# Patient Record
Sex: Female | Born: 1935 | Race: White | Hispanic: No | Marital: Married | Smoking: Never smoker
Health system: Southern US, Community
[De-identification: ages and names within clinical notes are randomized; demographics above are authoritative.]

## PROBLEM LIST (undated history)

## (undated) DIAGNOSIS — H269 Unspecified cataract: Secondary | ICD-10-CM

## (undated) DIAGNOSIS — I350 Nonrheumatic aortic (valve) stenosis: Secondary | ICD-10-CM

## (undated) DIAGNOSIS — I10 Essential (primary) hypertension: Secondary | ICD-10-CM

## (undated) DIAGNOSIS — I2510 Atherosclerotic heart disease of native coronary artery without angina pectoris: Secondary | ICD-10-CM

## (undated) DIAGNOSIS — M1990 Unspecified osteoarthritis, unspecified site: Secondary | ICD-10-CM

## (undated) DIAGNOSIS — I639 Cerebral infarction, unspecified: Secondary | ICD-10-CM

## (undated) DIAGNOSIS — I509 Heart failure, unspecified: Secondary | ICD-10-CM

## (undated) DIAGNOSIS — E7800 Pure hypercholesterolemia, unspecified: Secondary | ICD-10-CM

## (undated) HISTORY — DX: Cerebral infarction, unspecified: I63.9

## (undated) HISTORY — PX: CORONARY ANGIOPLASTY: SHX604

## (undated) HISTORY — DX: Heart failure, unspecified: I50.9

## (undated) HISTORY — DX: Unspecified osteoarthritis, unspecified site: M19.90

## (undated) HISTORY — PX: OTHER SURGICAL HISTORY: SHX169

## (undated) HISTORY — DX: Unspecified cataract: H26.9

## (undated) HISTORY — DX: Nonrheumatic aortic (valve) stenosis: I35.0

## (undated) HISTORY — DX: Atherosclerotic heart disease of native coronary artery without angina pectoris: I25.10

## (undated) HISTORY — DX: Essential (primary) hypertension: I10

## (undated) HISTORY — DX: Pure hypercholesterolemia, unspecified: E78.00

## (undated) HISTORY — PX: CATARACT EXTRACTION: SUR2

---

## 1997-11-11 ENCOUNTER — Other Ambulatory Visit: Admission: RE | Admit: 1997-11-11 | Discharge: 1997-11-11 | Payer: Self-pay | Admitting: Obstetrics and Gynecology

## 2004-05-26 ENCOUNTER — Ambulatory Visit: Payer: Self-pay | Admitting: Cardiovascular Disease

## 2004-06-09 ENCOUNTER — Ambulatory Visit: Payer: Self-pay | Admitting: Cardiovascular Disease

## 2004-06-09 ENCOUNTER — Ambulatory Visit: Payer: Self-pay

## 2004-12-21 ENCOUNTER — Ambulatory Visit: Payer: Self-pay | Admitting: Cardiovascular Disease

## 2005-07-12 ENCOUNTER — Ambulatory Visit: Payer: Self-pay | Admitting: Cardiovascular Disease

## 2006-01-12 ENCOUNTER — Ambulatory Visit: Payer: Self-pay | Admitting: Cardiovascular Disease

## 2006-02-12 ENCOUNTER — Emergency Department (HOSPITAL_COMMUNITY): Admission: EM | Admit: 2006-02-12 | Discharge: 2006-02-12 | Payer: Self-pay | Admitting: *Deleted

## 2006-07-20 ENCOUNTER — Ambulatory Visit: Payer: Self-pay | Admitting: Cardiovascular Disease

## 2007-02-16 ENCOUNTER — Ambulatory Visit: Payer: Self-pay

## 2007-02-16 ENCOUNTER — Ambulatory Visit: Payer: Self-pay | Admitting: Cardiovascular Disease

## 2007-05-30 ENCOUNTER — Ambulatory Visit: Payer: Self-pay

## 2007-07-09 ENCOUNTER — Ambulatory Visit: Payer: Self-pay | Admitting: Cardiovascular Disease

## 2007-09-19 ENCOUNTER — Ambulatory Visit: Payer: Self-pay | Admitting: Cardiovascular Disease

## 2007-10-16 ENCOUNTER — Ambulatory Visit: Payer: Self-pay | Admitting: Ophthalmology

## 2007-10-16 ENCOUNTER — Other Ambulatory Visit: Payer: Self-pay

## 2007-10-30 ENCOUNTER — Ambulatory Visit: Payer: Self-pay | Admitting: Ophthalmology

## 2007-12-13 ENCOUNTER — Ambulatory Visit: Payer: Self-pay | Admitting: Ophthalmology

## 2007-12-26 ENCOUNTER — Ambulatory Visit: Payer: Self-pay | Admitting: Ophthalmology

## 2008-03-20 ENCOUNTER — Ambulatory Visit: Payer: Self-pay | Admitting: Cardiovascular Disease

## 2008-03-20 ENCOUNTER — Ambulatory Visit: Payer: Self-pay

## 2008-03-20 ENCOUNTER — Encounter: Payer: Self-pay | Admitting: Cardiovascular Disease

## 2009-03-02 DIAGNOSIS — Z8739 Personal history of other diseases of the musculoskeletal system and connective tissue: Secondary | ICD-10-CM | POA: Insufficient documentation

## 2009-03-02 DIAGNOSIS — E7800 Pure hypercholesterolemia, unspecified: Secondary | ICD-10-CM | POA: Insufficient documentation

## 2009-03-02 DIAGNOSIS — R7303 Prediabetes: Secondary | ICD-10-CM

## 2009-03-02 DIAGNOSIS — I359 Nonrheumatic aortic valve disorder, unspecified: Secondary | ICD-10-CM | POA: Insufficient documentation

## 2009-03-02 DIAGNOSIS — I2510 Atherosclerotic heart disease of native coronary artery without angina pectoris: Secondary | ICD-10-CM

## 2009-03-02 DIAGNOSIS — I10 Essential (primary) hypertension: Secondary | ICD-10-CM | POA: Insufficient documentation

## 2009-03-05 ENCOUNTER — Ambulatory Visit: Payer: Self-pay | Admitting: Cardiovascular Disease

## 2009-11-12 ENCOUNTER — Ambulatory Visit: Admitting: Unknown Physician Specialty

## 2010-03-10 ENCOUNTER — Ambulatory Visit: Payer: Self-pay | Admitting: Cardiovascular Disease

## 2010-06-24 NOTE — Assessment & Plan Note (Signed)
Summary: F1Y/DM   CC:  check up.  History of Present Illness: [name] [name] is seen today for F/U of CAD, AS and risk factors.  Unfortunately her husband of 55 years died in Aug 19, 2022.  He had COPD.  She has a son near by but has difficulty with being alone.  He last echo in 2009 showed mild AS with gradients of 51mmHg/24mmHg.  She has a distant history of CAD.  She had PCI of the LAD by Dr. Tresa Endo in 1996, and stenting of the ostial RCa and circ by Dr. Primitivo Gauze in 1997.  She had a nonischemic myovue in January of 2009.  She is not having any SSCP but is sedentary.  She is tolerating low dose Crestor.  There has been no SSCP, PND, orthopnea, edema or palptations.  Current Problems (verified): 1)  Cad  (ICD-414.00) 2)  Aortic Stenosis  (ICD-424.1) 3)  Hypertension  (ICD-401.9) 4)  Arthritis, Hx of  (ICD-V13.4) 5)  Dm  (ICD-250.00) 6)  Hypercholesterolemia  (ICD-272.0)  Current Medications (verified): 1)  Aspirin 81 Mg Tbec (Aspirin) .... Take One Tablet By Mouth Daily 2)  Quinapril Hcl 40 Mg Tabs (Quinapril Hcl) .Marland Kitchen.. 1 Tab By Mouth Once Daily 3)  Furosemide 40 Mg Tabs (Furosemide) .... Take One Tablet By Mouth Daily. 4)  Glipizide 10 Mg Tabs (Glipizide) .Marland Kitchen.. 1 Tab By Mouth Once Daily 5)  Crestor 10 Mg Tabs (Rosuvastatin Calcium) .Marland Kitchen.. 1 Tab By Mouth Every Other Day 6)  Labetalol Hcl 200 Mg Tabs (Labetalol Hcl) .... 2 Tabs By Mouth Two Times A Day  Allergies (verified): No Known Drug Allergies  Past History:  Past Medical History: Last updated: 03/02/2009 Current Problems:  AORTIC STENOSIS (ICD-424.1) mild gradient 10/24 mmHG echo 02/2008 HYPERTENSION (ICD-401.9) ARTHRITIS, HX OF (ICD-V13.4) DM (ICD-250.00) HYPERCHOLESTEROLEMIA (ICD-272.0) CAD: distant history PCi RCA and Circ ?> 1997  Family History: Last updated: 03/02/2009 non-contributory  Social History: Last updated: 03/05/2009 Widowed August 18, 2008 Non-smoker Non-drinker Sedentary Has son 5 minutes away  Review of Systems   Denies fever, malais, weight loss, blurry vision, decreased visual acuity, cough, sputum, SOB, hemoptysis, pleuritic pain, palpitaitons, heartburn, abdominal pain, melena, lower extremity edema, claudication, or rash.   Vital Signs:  Patient profile:   77 year old female Height:      59 inches Weight:      174 pounds BMI:     35.27 Pulse rate:   72 / minute Resp:     14 per minute BP sitting:   130 / 70  (left arm)  Vitals Entered By: Kem Parkinson (March 10, 2010 10:04 AM)  Physical Exam  General:  Affect appropriate Healthy:  appears stated age HEENT: normal Neck supple with no adenopathy JVP normal no bruits no thyromegaly Lungs clear with no wheezing and good diaphragmatic motion Heart:  S1/S2 mild AS  murmur,rub, gallop or click PMI normal Abdomen: benighn, BS positve, no tenderness, no AAA no bruit.  No HSM or HJR Distal pulses intact with no bruits No edema Neuro non-focal Skin warm and dry    Impression & Recommendations:  Problem # 1:  CAD (ICD-414.00) Stable no angina continue ASA and BB Her updated medication list for this problem includes:    Aspirin 81 Mg Tbec (Aspirin) .Marland Kitchen... Take one tablet by mouth daily    Quinapril Hcl 40 Mg Tabs (Quinapril hcl) .Marland Kitchen... 1 tab by mouth once daily    Labetalol Hcl 200 Mg Tabs (Labetalol hcl) .Marland Kitchen... 2 tabs by mouth two times a day  Problem # 2:  AORTIC STENOSIS (ICD-424.1)  Still mild by exam F/U echo in 6 months Her updated medication list for this problem includes:    Quinapril Hcl 40 Mg Tabs (Quinapril hcl) .Marland Kitchen... 1 tab by mouth once daily    Furosemide 40 Mg Tabs (Furosemide) .Marland Kitchen... Take one tablet by mouth daily.    Labetalol Hcl 200 Mg Tabs (Labetalol hcl) .Marland Kitchen... 2 tabs by mouth two times a day  Orders: Echocardiogram (Echo)  Her updated medication list for this problem includes:    Quinapril Hcl 40 Mg Tabs (Quinapril hcl) .Marland Kitchen... 1 tab by mouth once daily    Furosemide 40 Mg Tabs (Furosemide) .Marland Kitchen... Take one  tablet by mouth daily.    Labetalol Hcl 200 Mg Tabs (Labetalol hcl) .Marland Kitchen... 2 tabs by mouth two times a day  Problem # 3:  HYPERTENSION (ICD-401.9)  Well controlled Her updated medication list for this problem includes:    Aspirin 81 Mg Tbec (Aspirin) .Marland Kitchen... Take one tablet by mouth daily    Quinapril Hcl 40 Mg Tabs (Quinapril hcl) .Marland Kitchen... 1 tab by mouth once daily    Furosemide 40 Mg Tabs (Furosemide) .Marland Kitchen... Take one tablet by mouth daily.    Labetalol Hcl 200 Mg Tabs (Labetalol hcl) .Marland Kitchen... 2 tabs by mouth two times a day  Her updated medication list for this problem includes:    Aspirin 81 Mg Tbec (Aspirin) .Marland Kitchen... Take one tablet by mouth daily    Quinapril Hcl 40 Mg Tabs (Quinapril hcl) .Marland Kitchen... 1 tab by mouth once daily    Furosemide 40 Mg Tabs (Furosemide) .Marland Kitchen... Take one tablet by mouth daily.    Labetalol Hcl 200 Mg Tabs (Labetalol hcl) .Marland Kitchen... 2 tabs by mouth two times a day  Problem # 4:  HYPERCHOLESTEROLEMIA (ICD-272.0) Tolerating crestor every other day.  F/U labs primary Her updated medication list for this proTblem includes:    Crestor 10 Mg Tabs (Rosuvastatin calcium) .Marland Kitchen... 1 tab by mouth every other day  Patient Instructions: 1)  Your physician recommends that you schedule a follow-up appointment in: 6 MONTHS WITH DR Eden Emms AND ECHO SAME DAY 2)  Your physician has recommended you make the following change in your medication:  3)  Your physician has requested that you have an echocardiogram.  Echocardiography is a painless test that uses sound waves to create images of your heart. It provides your doctor with information about the size and shape of your heart and how well your heart's chambers and valves are working.  This procedure takes approximately one hour. There are no restrictions for this procedure.6 MONTHS   EKG Report  Procedure date:  03/10/2010  Findings:      NSR 72 LVH Nonspecfic ST/T wave changes.

## 2010-09-16 ENCOUNTER — Encounter: Payer: Self-pay | Admitting: Cardiovascular Disease

## 2010-09-17 ENCOUNTER — Encounter: Payer: Self-pay | Admitting: Cardiovascular Disease

## 2010-09-17 ENCOUNTER — Ambulatory Visit (INDEPENDENT_AMBULATORY_CARE_PROVIDER_SITE_OTHER): Payer: Medicare Other | Admitting: Cardiovascular Disease

## 2010-09-17 VITALS — BP 175/79 | HR 71 | Resp 14 | Ht 59.0 in | Wt 178.0 lb

## 2010-09-17 DIAGNOSIS — I359 Nonrheumatic aortic valve disorder, unspecified: Secondary | ICD-10-CM

## 2010-09-17 DIAGNOSIS — I2510 Atherosclerotic heart disease of native coronary artery without angina pectoris: Secondary | ICD-10-CM

## 2010-09-17 DIAGNOSIS — E7800 Pure hypercholesterolemia, unspecified: Secondary | ICD-10-CM

## 2010-09-17 DIAGNOSIS — I350 Nonrheumatic aortic (valve) stenosis: Secondary | ICD-10-CM

## 2010-09-17 DIAGNOSIS — I10 Essential (primary) hypertension: Secondary | ICD-10-CM

## 2010-09-17 NOTE — Assessment & Plan Note (Signed)
Likely moderate by exam  F/U echo

## 2010-09-17 NOTE — Patient Instructions (Signed)
Your physician has requested that you have an echocardiogram. Echocardiography is a painless test that uses sound waves to create images of your heart. It provides your doctor with information about the size and shape of your heart and how well your heart's chambers and valves are working. This procedure takes approximately one hour. There are no restrictions for this procedure.  Your physician recommends that you schedule a follow-up appointment in: 6 months with Dr. Eden Emms

## 2010-09-17 NOTE — Assessment & Plan Note (Signed)
Stable with no angina and good activity level.  Continue medical Rx  

## 2010-09-17 NOTE — Assessment & Plan Note (Signed)
Well controlled.  Continue current medications and low sodium Dash type diet.    

## 2010-09-17 NOTE — Progress Notes (Signed)
[name] [name] is seen today for F/U of CAD, AS and risk factors.  Unfortunately her husband of 55 years died in 2022/08/20.  He had COPD.  She has a son near by but has difficulty with being alone.  He last echo in 2009 showed mild AS with gradients of 60mmHg/24mmHg.  She has a distant history of CAD.  She had PCI of the LAD by Dr. Tresa Endo in 1996, and stenting of the ostial RCa and circ by Dr. Primitivo Gauze in 1997.  She had a nonischemic myovue in January of 2009.  She is not having any SSCP but is sedentary.  She is tolerating low dose Crestor.  There has been no SSCP, PND, orthopnea, edema or palptations.  ROS: Denies fever, malais, weight loss, blurry vision, decreased visual acuity, cough, sputum, SOB, hemoptysis, pleuritic pain, palpitaitons, heartburn, abdominal pain, melena, lower extremity edema, claudication, or rash.   General: Affect appropriate Healthy:  appears stated age HEENT: normal Neck supple with no adenopathy JVP normal no bruits no thyromegaly Lungs clear with no wheezing and good diaphragmatic motion Heart:  S1/S2 AS  Murmur no ,rub, gallop or click PMI normal Abdomen: benighn, BS positve, no tenderness, no AAA no bruit.  No HSM or HJR Distal pulses intact with no bruits No edema Neuro non-focal Skin warm and dry No muscular weakness   Current Outpatient Prescriptions  Medication Sig Dispense Refill  . aspirin 81 MG tablet Take 81 mg by mouth daily.        . furosemide (LASIX) 40 MG tablet Take 40 mg by mouth daily.       Marland Kitchen glipiZIDE (GLUCOTROL) 10 MG tablet Take 10 mg by mouth daily.        Marland Kitchen labetalol (NORMODYNE) 200 MG tablet Take by mouth. Take 2 tablets twice a day       . quinapril (ACCUPRIL) 40 MG tablet Take 40 mg by mouth at bedtime.        . rosuvastatin (CRESTOR) 10 MG tablet Take 10 mg by mouth daily.          Allergies  Review of patient's allergies indicates not on file.  Electrocardiogram:  Assessment and Plan

## 2010-09-17 NOTE — Assessment & Plan Note (Signed)
Cholesterol is at goal.  Continue current dose of statin and diet Rx.  No myalgias or side effects.  F/U  LFT's in 6 months. No results found for this basename: LDLCALC             

## 2010-09-30 ENCOUNTER — Ambulatory Visit (HOSPITAL_COMMUNITY): Payer: Medicare Other | Attending: Cardiovascular Disease | Admitting: Radiology

## 2010-09-30 DIAGNOSIS — I350 Nonrheumatic aortic (valve) stenosis: Secondary | ICD-10-CM

## 2010-09-30 DIAGNOSIS — I359 Nonrheumatic aortic valve disorder, unspecified: Secondary | ICD-10-CM | POA: Insufficient documentation

## 2010-10-01 ENCOUNTER — Telehealth: Payer: Self-pay | Admitting: Cardiovascular Disease

## 2010-10-01 NOTE — Telephone Encounter (Signed)
Pt aware of echo results [name] [name]  

## 2010-10-01 NOTE — Telephone Encounter (Signed)
Pt rtn call to debra from yesterday, was told to call back this am

## 2010-10-05 NOTE — Assessment & Plan Note (Signed)
[facility]                            CARDIOLOGY OFFICE NOTE   NAME:[name], [name]                           MRN:          [id number]  DATE:03/20/2008                            DOB:          30-Nov-1931    [name] [name] returns today for followup.  She had a 2-D echocardiogram today, I  revived it.  She continues to have mild aortic stenosis.  Mean gradient  was 14, peak gradient was 20.  She is otherwise doing fairly well.  She  has a bit of arthritis in her knees and is getting around with a cane.  She has a distant history of coronary angioplasty of the distal circ.  Myoview in January 2009 was nonischemic with a normal EF.  She is not  having chest pain.  Her risk factors including hypercholesterolemia,  diabetes and hypertension are well controlled.  She continues to Dr. Marisue Brooklyn for medical needs.  She has been having fun playing with her  75 year old grandson, [name] [name].  She has two older granddaughters who are  teenagers.   She is intolerant to STATIN DRUGS.   REVIEW OF SYSTEMS:  Otherwise negative in particular.  She is not having  significant dyspnea, PND, or orthopnea.  Lower extremity edema has  improved.   MEDICATIONS:  1. Quinapril 40 a day.  2. Lasix 40 a day.  3. Glipizide 10 a day.  4. Aspirin a day.  5. Crestor 5 a day.   PHYSICAL EXAMINATION:  GENERAL:  Her exam is remarkable for an  overweight white female with some hirsutism.  VITAL SIGNS:  Weight is 179, blood pressure 140/80, pulse 80 and  regular, respiratory rate 14, afebrile.  HEENT:  Unremarkable.  NECK:  Carotids are normal without bruit.  No lymphadenopathy,  thyromegaly, or JVP elevation.  LUNGS:  Clear with good diaphragmatic motion.  No wheezing.  HEART:  S1 with a mild AS murmur.  Second heart sound is preserved.  PMI  normal.  ABDOMEN:  Benign.  Bowel sounds positive.  No AAA, no tenderness, no  bruit; no hepatosplenomegaly, hepatojugular reflux, or tenderness.  EXTREMITIES:  Distal pulses are intact.  No edema.  NEURO:  Nonfocal.  SKIN:  Warm and dry.  MUSCULOSKELETAL:  No muscular weakness.   IMPRESSION:  1. Mild aortic stenosis.  Followup echo in a year.  2. Hypercholesterolemia, last LDL cholesterol was around 100, this is      reasonable.  Continue low-dose Crestor.  3. Hypertension, currently well controlled.  Continue current dose of      angiotensin-converting enzyme inhibitor and diuretic.  4. Significant arthritis.  As needed Celebrex or Motrin.  5. Primary care needs.  The patient did get her flu shot.  She will      follow up with Dr. Marisue Brooklyn.  I will see her back in a year.     Noralyn Pick. Eden Emms, MD, [facility]  Electronically Signed    PCN/MedQ  DD: 03/20/2008  DT: 03/21/2008  Job #: [id number]

## 2010-10-05 NOTE — Assessment & Plan Note (Signed)
[facility]                            CARDIOLOGY OFFICE NOTE   NAME:PELLJonne, Rote                           MRN:          [id number]  DATE:09/19/2007                            DOB:          1931/10/16    [name] [name] returns today for follow-up.  She is status post a distant history  of coronary artery angioplasty of the distal right circ.  She had a  Myoview in January 2009, which was nonischemic with a good EF.  She is  not having chest pain.  Her activity is limited by knee pain.  She sees  Dr. Hayden Rasmussen for this.  She has mild aortic stenosis and needs a  follow-up echo in 6 months.  She denied any palpitations, PND or  orthopnea.  There has been no syncope.   Her review of systems otherwise remarkable for significant  osteoarthritis.  It is in her hands, but particularly her knees.  Her  left ankle also has previously been broken and hurts.  She has had some  prednisone injections by Dr. Thomasena Edis with good relief.   CURRENT MEDICATIONS:  1. Quinapril 40 mg a day.  2. Lasix 40 a day.  3. Glipizide 10 a day.  4. Crestor 5 every other day.  5. Labetalol 400 mg b.i.d.  6. Aspirin daily.   ALLERGIES:  SHE HAS SOME INTOLERANCES TO STATIN DRUGS, BUT SHE IS  TOLERATING CRESTOR EVERY OTHER DAY.   PHYSICAL EXAMINATION:  GENERAL:  Remarkable for an overweight elderly  white female in no distress.  VITAL SIGNS:  Weight is 175, blood pressure is 146/79, pulse 76 and  regular, afebrile.  Respiratory rate 14.  HEENT:  Unremarkable.  NECK:  Carotids normal without bruit.  No lymphadenopathy, thyromegaly  or JVP elevation.  A mild transmitted murmur.  LUNGS:  Clear, good diaphragmatic motion.  No wheezing.  HEART:  S1-S2 with a mild AS murmur.  No AI.  PMI normal.  ABDOMEN:  Benign.  Bowel sounds positive.  No AAA, no tenderness, no  hepatosplenomegaly OR hepatojugular reflux.  No bruit.  EXTREMITIES:  Distal pulses are intact, no edema.  NEURO:   Nonfocal.  SKIN:  Warm and dry.  No muscular weakness.  She does have some crepitus  in both knees with decreased range of motion to the left ankle   IMPRESSION:  1. Coronary disease.  Distant history of angioplasty and nonischemic      Myoview in January.  Continue aspirin and beta-blocker.  2. Mild aortic stenosis.  Follow-up echo in 6 months.  Hopefully, this      will not be an issue at her age.  3. Hyperlipidemia and tolerating Crestor every other day.  Check lipid      and liver profile in 6 months.  4. Lower extremity edema, improved.  Continue Lasix at current dose,      check BMET and BMP in 6 months.  5. Osteoarthritis.  Follow-up with Dr. Hayden Rasmussen.  Continue      prednisone injections as-needed.  6. Diabetes, currently under good control.  Hemoglobin  A1c quarterly.      No hypoglycemic reactions.     Noralyn Pick. Eden Emms, MD, [facility]  Electronically Signed    PCN/MedQ  DD: 09/19/2007  DT: 09/19/2007  Job #: [id number]

## 2010-10-05 NOTE — Assessment & Plan Note (Signed)
[facility]                            CARDIOLOGY OFFICE NOTE   NAME:[name] [name], [name] [name]                           MRN:          [id number]  DATE:07/09/2007                            DOB:          December 22, 1931    [place] returns today for follow-up.  She has distant history of  angioplasty of the right circ.  She has had a Myoview which was normal  with no ischemia or infarction.  She is not having chest pain.  Her risk  factors are well modified   The the patient unfortunately seems to have developed myalgias.  She has  previously been unable to tolerate Vytorin.  She is on 20 of Lipitor.  I  told her we would stop this and try Crestor 5 every other day to see if  her myalgias improve.   She is otherwise doing well.  She does have osteoarthritis of the left  knee.  However, the myalgias are different than this and involve both  legs and her right shoulder.   The patient's current meds include labetalol 200, quinapril 40 a day,  Lasix 40 a day, an aspirin a day, glipizide 10 a day.   She will be on Crestor 5 every other day.   EXAM:  Remarkable for blood pressure of 160/80.  She takes her blood  pressure at home on a regular basis and it runs around 120/80, pulse is  70 and regular, respiratory rate 14, afebrile.  HEENT:  Unremarkable.  Carotids are normal without bruit, no lymphadenopathy, thyromegaly, JVP  elevation.  LUNGS:  Clear diaphragmatic motion wheezing.  S1-S2 with a mild AS murmur.  PMI normal.  ABDOMEN:  Benign.  Bowel sounds positive no AAA.  No hepatosplenomegaly  or hepatojugular reflux.  Distal pulse intact, no edema.  NEURO:  Nonfocal.  SKIN:  Warm and dry.  No muscular weakness.   IMPRESSION:  1. Distant history coronary disease in a diabetic.  Nonischemic      Myoview.  Continue aspirin and beta blocker.  2. Hypertension component of white coat hypertension.  Continue blood      pressure readings at home, low-salt diet.  Continue  three drug      regimen.  3. Diabetes.  Continue glipizide 10 mg a day.  Hemoglobin A1c      quarterly.  4. Hypercholesterolemia with myalgias on Vytorin and Lipitor.  Try      Crestor 5 every other day.  Follow-up in 8 weeks.  Consider follow-      up CPK and LFTs  once we had a stable regimen.   Overall I think that he is doing well and hopefully we can get her on  some cholesterol medicine since she is a diabetic with known coronary  disease.     Noralyn Pick. Eden Emms, MD, [facility]  Electronically Signed    PCN/MedQ  DD: 07/09/2007  DT: 07/09/2007  Job #: [id number]

## 2010-10-05 NOTE — Assessment & Plan Note (Signed)
[facility]                            CARDIOLOGY OFFICE NOTE   NAME:PELLKaysi, [name]                           MRN:          [id number]  DATE:02/16/2007                            DOB:          September 07, 1931    [name] [name] returns today for followup.  She has had a distant history of  angioplasty to the right coronary artery and circ.  The last time I saw  her she had a new murmur and her echo showed mild aortic stenosis.  I  explained this to her.  At her age, at 9, I do not think it will be an  issue for her in the future.  She has been fairly active.  She has  denied any significant chest pain, angina, PND or orthopnea.  There has  been no palpitations.  Her activity level is more limited by  osteoarthritis of the left knee.  She has been compliant with her meds.  She had been on Lipitor in the past without problems.  Apparently, her  primary care doc stopped this and placed her on Vytorin.  She had  horrible cramps with it and is currently not on any medication.  The  last LDL that I saw was over 100.  I told her I would like to put her  back on Lipitor 20 mg a day and she can follow up with her primary M.D.  for followup of her lipids and liver.   In general, she has been doing well.  She struggles sometimes taking  care of her husband who has end-stage emphysema.  He had to go to the  hospital on Saturday after falling.   Otherwise, [name] [name] seems to be doing well and is enjoying time with her  grand kids.   CURRENT MEDICATIONS:  1. Labetalol 200 b.i.d.  2. Quinapril 40 a day.  3. Lasix 40 a day.  4. An aspirin a day.  5. Glipizide 10 a day.  6. Now Lipitor 20 a day.   EXAM:  Remarkable for an elderly white female in no distress.  Affect is  appropriate, weight is 174, blood pressure is 130/75, pulse is 62 and  regular, respiratory rate is 14.  HEENT:  Normal.  Carotids are normal without bruit.  There is no  lymphadenopathy, no thyromegaly and no JVP  elevation.  LUNGS:  Clear with good diaphragmatic motion, no wheezing.  There is an  S1 S2 with a mild AS murmur.  Second heart sound is preserved, there is  no AI.  PMI is normal.  ABDOMEN:  Benign.  Bowel sounds are positive.  No tenderness, no AAA, no  hepatosplenomegaly or hepatojugular reflux.  Distal pulses are intact  with no edema.  NEURO:  Nonfocal.  There is no muscular weakness.  SKIN:  Warm and dry.   IMPRESSION:  1. Coronary disease, distant history of right coronary artery and      circumflex angioplasties, last Myoview January of 2006, followup      Myoview in January of 2009.  Even though she is asymptomatic, she  is a diabetic and needs a followup functional study in three years.  2. Hyperlipidemia, currently not treated.  Resume Lipitor 20 a day.      Watch for signs of myalgias.  Follow up with primary care M.D. for      this.  Low cholesterol diet.  3. Hypertension.  Currently well controlled.  Continue quinapril and      beta-blocker and Lasix, low salt diet.  Patient does monitor her      blood pressure occasionally at home and it has been running fine.  4. Diabetes.  Continue glipizide.  Hemoglobin A1c quarterly.  No      secondary signs of neuropathy at this time.  5. Mild aortic stenosis.  No need for spontaneous bacterial      endocarditis prophylaxis.  Followup echocardiogram in a year.      Currently asymptomatic.   Overall, [name] [name] is doing well.  I will see her back in January when she  has her stress test.     Theron Arista C. Eden Emms, MD, [facility]  Electronically Signed    PCN/MedQ  DD: 02/16/2007  DT: 02/16/2007  Job #: [id number]

## 2010-10-08 NOTE — Assessment & Plan Note (Signed)
[facility]                              CARDIOLOGY OFFICE NOTE   NAME:[name], [name]                           MRN:          [id number]  DATE:01/12/2006                            DOB:          January 25, 1932    [name] [name] returns today for followup.  She has a history of known coronary  disease.  She has had very distant angioplasties of the right circumflex  about ten years ago.  Her last Myoview in 2006 was non-ischemic.  She is not  having chest pain.  She Dr. Marisue Brooklyn  who follows her lab work and risk  factors.  She is on the Statin drug.  She tells me she just had blood work  done and her LFTs were normal.  She is on an oral hypoglycemic and she says  her sugars have been excellent.  She is active.  She spends a lot of time  with her [name].  She is not having chest pain, PND or  orthopnea or dyspnea.  Her husband does have significant O2 dependent  emphysema and she has to care for him, particularly in the summer time.   EXAMINATION:  GENERAL:  She looks well.  VITAL SIGNS:  Weight is stable.  Blood pressure  is 130/80.  Pulse 70 and  regular.  LUNGS:  Clear.  CARDIOVASCULAR:  Carotids normal.  S1, S2 normal heart sounds.  ABDOMEN:  Benign.  EXTREMITIES:  Lower extremities intact pulses.  No edema.   MEDICATIONS:  Are listed in the chart.   She is on Labetalol in regards to her blood pressure and history of coronary  artery disease.  She is on Quinapril in regards to her blood pressure and  diabetes.   Her oral hyperglycemic is glipizide and she has not had significant fluid  retention.   IMPRESSION:  1. Stable coronary artery disease.  Good risk modification, asymptomatic.      No need for stress test this year.  2. I will see her back in a year.  Overall I am pleased with her progress.  3. Dr. Elisabeth Most is following her risk factors and her liver function      tests quite closely.  I do not know what her  hemoglobin A1C is but I      suspect that this has also been checked by Dr. Elisabeth Most.                               Noralyn Pick. Eden Emms, MD, [facility]    PCN/MedQ  DD:  01/12/2006  DT:  01/12/2006  Job #:  [id number]

## 2010-10-08 NOTE — Assessment & Plan Note (Signed)
[facility]                            CARDIOLOGY OFFICE NOTE   NAME:Pettaway, ANANIAH                           MRN:          [id number]  DATE:07/20/2006                            DOB:          10/17/1931    Nozomi returns today for followup.  She is doing fairly well.   She has known coronary artery disease.  She has a history of distant  angioplasties of the right coronary artery and circ.   Her last Myoview was in January 2006 and was normal with an EF of 72%.  She has not had any significant chest pain.   I told Shawnie since she is doing well and not having chest pain, I did  not feel compelled to do a stress test currently.   Since I last saw her, she had significant myalgias and muscular pain due  to her Vytorin.  She had previously been on Lipitor that she tolerated.   Since she does have coronary disease and hyperlipidemia, I told her that  we would probably put her back on Lipitor 10 mg daily and monitor her  closely.   In regards to her other risk factors, I do not have a recent hemoglobin  A1c.  This is usually checked by Dr. Marisue Brooklyn.  She is on Glipizide  and says her sugars have been reasonable.   Her blood pressure is well controlled on Quinapril and Labetalol.   She has not had any lower extremity edema on her current dose of Lasix.   EXAM:  The blood pressure is 150/80.  Pulse is 69 and regular.  HEENT:  Normal.  The carotids are normal without bruit.  There in no  parvus and no tardus.  LUNGS:  Clear.  There is a S1, S2 with a mild AS murmur.  ABDOMEN:  Benign.  Lower extremities with intact pulses.  No edema.   Her EKG shows a sinus rhythm at a rate of 69 with nonspecific ST-T wave  changes.  Her QT interval is 490.   IMPRESSION:  1. Hyperlipidemia on statin drug.  The patient has stopped her Vytorin      and had marked improvement in her ability to ambulate.  Her LDL      cholesterol tends to run in the 80 to 90  range on statin drugs.  We      will put her back on Lipitor at a dose of 10 mg daily.  She will      have followup CPK and LFTs in 3 to 6 months.  2. For coronary disease what appeared to be stable, she is not having      any significant angina.  She has sublingual nitroglycerin if she      needs it. I suspect we can wait a year to do a followup Myoview.      In a diabetic with known coronary disease, I do not like to go more      than 3 years without functional study.  3. Her blood pressure is reasonably controlled on Labetalol and  Quinapril.  She will continue these medicines and try to avoid salt      in her diet.  4. In regards to her lower extremity edema, we will continue her low-      dose Lasix at 40 mg daily.  5. For stroke prevention and her coronary disease, she will continue a      full aspirin daily.   I have reviewed my previous notes and do not recall hearing an AS murmur  on the patient.   She will have a followup echo in 6 months when she sees me.  Her second  heart sound is preserved and she is asymptomatic and I doubt that she  has significant AS.   Overall, I think Lavella is doing fairly.  She continues to enjoy the  company of her grandson, Gerilyn Pilgrim, and continues to have a very hard time  taking care of her husband who has fairly advanced emphysema on home O2.     Noralyn Pick. Eden Emms, MD, [facility]  Electronically Signed    PCN/MedQ  DD: 07/20/2006  DT: 07/20/2006  Job #: [id number]

## 2010-12-22 DIAGNOSIS — I639 Cerebral infarction, unspecified: Secondary | ICD-10-CM

## 2010-12-22 HISTORY — DX: Cerebral infarction, unspecified: I63.9

## 2011-01-14 ENCOUNTER — Telehealth (INDEPENDENT_AMBULATORY_CARE_PROVIDER_SITE_OTHER): Payer: Self-pay

## 2011-01-14 NOTE — Telephone Encounter (Signed)
error 

## 2011-01-20 ENCOUNTER — Ambulatory Visit (INDEPENDENT_AMBULATORY_CARE_PROVIDER_SITE_OTHER): Payer: Medicare Other | Admitting: Cardiovascular Disease

## 2011-01-20 ENCOUNTER — Encounter: Payer: Self-pay | Admitting: Cardiovascular Disease

## 2011-01-20 DIAGNOSIS — E7800 Pure hypercholesterolemia, unspecified: Secondary | ICD-10-CM

## 2011-01-20 DIAGNOSIS — G459 Transient cerebral ischemic attack, unspecified: Secondary | ICD-10-CM

## 2011-01-20 DIAGNOSIS — E119 Type 2 diabetes mellitus without complications: Secondary | ICD-10-CM

## 2011-01-20 DIAGNOSIS — I6930 Unspecified sequelae of cerebral infarction: Secondary | ICD-10-CM | POA: Insufficient documentation

## 2011-01-20 DIAGNOSIS — I359 Nonrheumatic aortic valve disorder, unspecified: Secondary | ICD-10-CM

## 2011-01-20 DIAGNOSIS — I2510 Atherosclerotic heart disease of native coronary artery without angina pectoris: Secondary | ICD-10-CM

## 2011-01-20 DIAGNOSIS — I10 Essential (primary) hypertension: Secondary | ICD-10-CM

## 2011-01-20 MED ORDER — CLOPIDOGREL BISULFATE 75 MG PO TABS: 75.0000 mg | ORAL_TABLET | Freq: Every day | ORAL | Status: DC

## 2011-01-20 NOTE — Assessment & Plan Note (Signed)
Stable with no angina and good activity level.  Continue medical Rx  

## 2011-01-20 NOTE — Patient Instructions (Signed)
Your physician wants you to follow-up in: MAY 2013 WITH AN ECHO You will receive a reminder letter in the mail two months in advance. If you don't receive a letter, please call our office to schedule the follow-up appointment.    Your physician has requested that you have a carotid duplex. This test is an ultrasound of the carotid arteries in your neck. It looks at blood flow through these arteries that supply the brain with blood. Allow one hour for this exam. There are no restrictions or special instructions.  MRI OF THE BRAIN W/O CONTRAST-QUESTION TIA  START PLAVIX 75 MG ONCE DAILY  Your physician has requested that you have an echocardiogram. Echocardiography is a painless test that uses sound waves to create images of your heart. It provides your doctor with information about the size and shape of your heart and how well your heart's chambers and valves are working. This procedure takes approximately one hour. There are no restrictions for this procedure.IN MAY 2013

## 2011-01-20 NOTE — Assessment & Plan Note (Signed)
Target A1c 6.5 or less.  Low carb diet  Continue oral hypoglycemics

## 2011-01-20 NOTE — Progress Notes (Signed)
[name] [name] is seen today for F/U of CAD, AS and risk factors. Unfortunately her husband of 55 years died in August 19, 2022. He had COPD. She has a son near by but has difficulty with being alone. He last echo in 2009 showed mild AS with gradients of 92mmHg/24mmHg. She has a distant history of CAD. She had PCI of the LAD by Dr. Tresa Endo in 1996, and stenting of the ostial RCa and circ by Dr. Primitivo Gauze in 1997. She had a nonischemic myovue in January of 2009. She is not having any SSCP but is sedentary. She is tolerating low dose Crestor. There has been no SSCP, PND, orthopnea, edema or palptations.  Has had 3 episodes of slurred speech.  One related to coca-cola and aleve.  No other arm/leg weakness.  Resolved in minutes.  Son took her to firehouse one time and vitals normal.  Reviewed ECG done there and it was normal.  Not associated with palpitations or visual changes  Echo 09/30/10  - Left ventricle: The cavity size was normal. There was mild concentric hypertrophy. Systolic function was normal. The estimated ejection fraction was in the range of 55% to 60%. Wall motion was normal; there were no regional wall motion abnormalities. Doppler parameters are consistent with abnormal left ventricular relaxation (grade 1 diastolic dysfunction). - Aortic valve: There was moderate stenosis. Trivial regurgitation. Mean gradient: 22mm Hg (S). Peak gradient: 38mm Hg (S). - Left atrium: The atrium was mildly dilated.     ROS: Denies fever, malais, weight loss, blurry vision, decreased visual acuity, cough, sputum, SOB, hemoptysis, pleuritic pain, palpitaitons, heartburn, abdominal pain, melena, lower extremity edema, claudication, or rash.  All other systems reviewed and negative  General: Affect appropriate Healthy:  appears stated age HEENT: normal Neck supple with no adenopathy JVP normal no bruits no thyromegaly Lungs clear with no wheezing and good diaphragmatic motion Heart:  S1/S2 preserved moderate AS  Murmur  no ,rub, gallop or click PMI normal Abdomen: benighn, BS positve, no tenderness, no AAA no bruit.  No HSM or HJR Distal pulses intact with no bruits No edema Neuro non-focal Skin warm and dry No muscular weakness   Current Outpatient Prescriptions  Medication Sig Dispense Refill  . aspirin 81 MG tablet Take 81 mg by mouth daily.        . furosemide (LASIX) 40 MG tablet Take 40 mg by mouth daily.       Marland Kitchen glipiZIDE (GLUCOTROL) 10 MG tablet Take 10 mg by mouth daily.        Marland Kitchen labetalol (NORMODYNE) 200 MG tablet Take by mouth. Take 2 tablets twice a day       . quinapril (ACCUPRIL) 40 MG tablet Take 40 mg by mouth at bedtime.          Allergies  Sulfa antibiotics  Electrocardiogram: NSR 70  Voltage for LVH nonspecific inferior T wave changes  Assessment and Plan

## 2011-01-20 NOTE — Assessment & Plan Note (Signed)
No change in murmur.  F/U echo 5/13.

## 2011-01-20 NOTE — Assessment & Plan Note (Signed)
Well controlled.  Continue current medications and low sodium Dash type diet.    

## 2011-01-20 NOTE — Assessment & Plan Note (Signed)
Start Plavix.  MRI and carotid duplex.  Avoid aleve and coca-cola

## 2011-01-20 NOTE — Assessment & Plan Note (Signed)
Cholesterol is at goal.  Continue current dose of statin and diet Rx.  No myalgias or side effects.  F/U  LFT's in 6 months. No results found for this basename: LDLCALC  LDL under 100 at primary office per patient

## 2011-01-21 ENCOUNTER — Emergency Department (HOSPITAL_COMMUNITY)
Admission: EM | Admit: 2011-01-21 | Discharge: 2011-01-21 | Disposition: A | Discharge status: Home / Self Care | Payer: Medicare Other | Attending: Emergency Medicine | Admitting: Emergency Medicine

## 2011-01-21 ENCOUNTER — Ambulatory Visit (HOSPITAL_COMMUNITY)
Admission: RE | Admit: 2011-01-21 | Discharge: 2011-01-21 | Disposition: A | Discharge status: Home / Self Care | Payer: Medicare Other | Source: Ambulatory Visit | Attending: Cardiovascular Disease | Admitting: Cardiovascular Disease

## 2011-01-21 ENCOUNTER — Telehealth: Payer: Self-pay | Admitting: Cardiovascular Disease

## 2011-01-21 DIAGNOSIS — E785 Hyperlipidemia, unspecified: Secondary | ICD-10-CM | POA: Insufficient documentation

## 2011-01-21 DIAGNOSIS — R4789 Other speech disturbances: Secondary | ICD-10-CM | POA: Insufficient documentation

## 2011-01-21 DIAGNOSIS — I2510 Atherosclerotic heart disease of native coronary artery without angina pectoris: Secondary | ICD-10-CM | POA: Insufficient documentation

## 2011-01-21 DIAGNOSIS — I10 Essential (primary) hypertension: Secondary | ICD-10-CM | POA: Insufficient documentation

## 2011-01-21 DIAGNOSIS — Z8673 Personal history of transient ischemic attack (TIA), and cerebral infarction without residual deficits: Secondary | ICD-10-CM | POA: Insufficient documentation

## 2011-01-21 DIAGNOSIS — Z79899 Other long term (current) drug therapy: Secondary | ICD-10-CM | POA: Insufficient documentation

## 2011-01-21 DIAGNOSIS — I252 Old myocardial infarction: Secondary | ICD-10-CM | POA: Insufficient documentation

## 2011-01-21 DIAGNOSIS — M4802 Spinal stenosis, cervical region: Secondary | ICD-10-CM | POA: Insufficient documentation

## 2011-01-21 DIAGNOSIS — G459 Transient cerebral ischemic attack, unspecified: Secondary | ICD-10-CM

## 2011-01-21 DIAGNOSIS — E119 Type 2 diabetes mellitus without complications: Secondary | ICD-10-CM | POA: Insufficient documentation

## 2011-01-21 DIAGNOSIS — I359 Nonrheumatic aortic valve disorder, unspecified: Secondary | ICD-10-CM

## 2011-01-21 DIAGNOSIS — I6350 Cerebral infarction due to unspecified occlusion or stenosis of unspecified cerebral artery: Secondary | ICD-10-CM | POA: Insufficient documentation

## 2011-01-21 DIAGNOSIS — Z9861 Coronary angioplasty status: Secondary | ICD-10-CM | POA: Insufficient documentation

## 2011-01-21 LAB — LIPID PANEL
Cholesterol: 268 mg/dL — ABNORMAL HIGH (ref 0–200)
HDL: 35 mg/dL — ABNORMAL LOW (ref >39)
LDL Cholesterol: 179 mg/dL — ABNORMAL HIGH (ref 0–99)
Triglycerides: 271 mg/dL — ABNORMAL HIGH (ref <150)
VLDL: 54 mg/dL — ABNORMAL HIGH (ref 0–40)

## 2011-01-21 LAB — POCT I-STAT, CHEM 8
Calcium, Ion: 1.22 mmol/L (ref 1.12–1.32)
Creatinine, Ser: 0.7 mg/dL (ref 0.50–1.10)
Glucose, Bld: 91 mg/dL (ref 70–99)
HCT: 37 % (ref 36.0–46.0)
Hemoglobin: 12.6 g/dL (ref 12.0–15.0)
Potassium: 3.6 mEq/L (ref 3.5–5.1)

## 2011-01-21 LAB — CBC
HCT: 33.6 % — ABNORMAL LOW (ref 36.0–46.0)
Hemoglobin: 11 g/dL — ABNORMAL LOW (ref 12.0–15.0)
MCHC: 32.7 g/dL (ref 30.0–36.0)
MCV: 90.8 fL (ref 78.0–100.0)
RDW: 13.9 % (ref 11.5–15.5)

## 2011-01-21 LAB — DIFFERENTIAL
Eosinophils Relative: 2 % (ref 0–5)
Lymphocytes Relative: 23 % (ref 12–46)
Lymphs Abs: 2 10*3/uL (ref 0.7–4.0)
Monocytes Absolute: 0.7 10*3/uL (ref 0.1–1.0)
Monocytes Relative: 9 % (ref 3–12)
Neutro Abs: 5.8 10*3/uL (ref 1.7–7.7)

## 2011-01-21 LAB — POCT I-STAT TROPONIN I: Troponin i, poc: 0 ng/mL (ref 0.00–0.08)

## 2011-01-21 LAB — APTT: aPTT: 30 seconds (ref 24–37)

## 2011-01-21 NOTE — Telephone Encounter (Signed)
Spoke with patient regarding MRI results. Patient states she is doing fine no symptoms at this time. Patient states after the MRI  she was send to [place] where more testing were done. She was made aware there that the tests done at [facility] will be send to Dr. Eden Emms  for reviewing and that she will be call back with recommendations on Tuesday.

## 2011-01-25 ENCOUNTER — Encounter: Payer: Self-pay | Admitting: Neurology

## 2011-01-25 NOTE — Telephone Encounter (Signed)
Spoke with pt, appt made for her to follow up with dr Modesto Charon at [facility] to f/u stroke. Deliah Goody

## 2011-01-25 NOTE — Telephone Encounter (Signed)
Follow up appt made with dr Modesto Charon at [facility] Deliah Goody

## 2011-02-03 ENCOUNTER — Ambulatory Visit (INDEPENDENT_AMBULATORY_CARE_PROVIDER_SITE_OTHER): Payer: Medicare Other | Admitting: Neurology

## 2011-02-03 ENCOUNTER — Encounter: Payer: Self-pay | Admitting: Neurology

## 2011-02-03 ENCOUNTER — Other Ambulatory Visit (INDEPENDENT_AMBULATORY_CARE_PROVIDER_SITE_OTHER): Payer: Medicare Other

## 2011-02-03 ENCOUNTER — Telehealth: Payer: Self-pay | Admitting: Cardiovascular Disease

## 2011-02-03 VITALS — BP 162/84 | HR 72 | Ht 59.0 in | Wt 178.0 lb

## 2011-02-03 DIAGNOSIS — G609 Hereditary and idiopathic neuropathy, unspecified: Secondary | ICD-10-CM

## 2011-02-03 DIAGNOSIS — I6350 Cerebral infarction due to unspecified occlusion or stenosis of unspecified cerebral artery: Secondary | ICD-10-CM

## 2011-02-03 DIAGNOSIS — R7309 Other abnormal glucose: Secondary | ICD-10-CM

## 2011-02-03 DIAGNOSIS — I639 Cerebral infarction, unspecified: Secondary | ICD-10-CM

## 2011-02-03 LAB — COMPREHENSIVE METABOLIC PANEL
Alkaline Phosphatase: 75 U/L (ref 39–117)
BUN: 14 mg/dL (ref 6–23)
Glucose, Bld: 82 mg/dL (ref 70–99)
Sodium: 142 mEq/L (ref 135–145)
Total Bilirubin: 1 mg/dL (ref 0.3–1.2)
Total Protein: 8.5 g/dL — ABNORMAL HIGH (ref 6.0–8.3)

## 2011-02-03 LAB — CBC WITH DIFFERENTIAL/PLATELET
Basophils Relative: 0.5 % (ref 0.0–3.0)
Eosinophils Relative: 2.1 % (ref 0.0–5.0)
HCT: 36.8 % (ref 36.0–46.0)
Hemoglobin: 12.2 g/dL (ref 12.0–15.0)
Lymphs Abs: 2 10*3/uL (ref 0.7–4.0)
MCV: 92.4 fl (ref 78.0–100.0)
Monocytes Absolute: 0.8 10*3/uL (ref 0.1–1.0)
Neutro Abs: 6.3 10*3/uL (ref 1.4–7.7)
RBC: 3.99 Mil/uL (ref 3.87–5.11)
WBC: 9.3 10*3/uL (ref 4.5–10.5)

## 2011-02-03 NOTE — Telephone Encounter (Signed)
Spoke with pt, appt for the carotid dopplers canceled. Dr Modesto Charon the neurologist has ordered a MRA of the neck instead [name] [name]

## 2011-02-03 NOTE — Telephone Encounter (Signed)
Pt calling wanting to speak with Stanton Kidney regarding pt appt on Monday. Pt said she has to go to [facility] on Friday to get a test, MRI. Pt wanting to call and see why she needs test on Monday. Please return pt call to discuss further.

## 2011-02-03 NOTE — Progress Notes (Signed)
Dear Dr. Eden Emms,  Thank you for having me see [name] [name] in consultation today for her ischemic stroke.  As you may recall she is a 77 year old RHD woman with a history of CAD, DM, HTN and AS who presents with an event two weeks ago where she had some slurring of her voice.  This only lasted minutes, and then later on in the day occurred.  She then had 3 other episodes of slurring each successive morning lasting minute for 3 days.  After hearing her story you had her imaged and an MRI brain revealed a right MCA distribution parietal infarction that was subacute.    She was urgently seen at [facility] hospital because of the finding.  There she got a carotid U/S that showed a 0-59% stenosis of her right ICA but a normal left ICA.  You added clopidogrel to her regimen.  She has done well since the event.  She has had no further spells of slurred speech and no other neurologic complaints.  PMHx:  DM, CAD s/p stenting, AS, HTN, HLD.  No history of strokes or other neurologic events.  SocHx:  No tob, no EtOH.  Widowed.  FamHx:  Brother with an ischemic stroke.  No other neurologic disease in family.  ROS:  13 systems were reviewed and are positive for leg pain when walking, feeling that she has weak arms, and arthritis.  Other ROS negative.  Examination:  Filed Vitals:   02/03/11 0904  BP: 162/84  Pulse: 72   Gen:  Well nourished older woman in NAD.  Cardiovascular: The patient has a regular rate and rhythm and no carotid bruits.  Fundoscopy:  Disks are flat. Arterial caliber small.  Mental status:   The patient is oriented to person, place and time. Recent and remote memory are intact. Attention span and concentration are normal. Language including repetition, naming, following commands are intact. Fund of knowledge of current and historical events, as well as vocabulary are normal.  Cranial Nerves: Pupils are equally round and reactive to light. Visual fields full to confrontation.  Extraocular movements are intact without nystagmus. Facial sensation and muscles of mastication are intact. Muscles of facial expression are symmetric. Hearing intact to bilateral finger rub. Tongue protrusion, uvula, palate midline.  Shoulder shrug intact  Motor:  The patient has normal bulk and tone, no pronator drift and 5/5 strength bilaterally except 4/5 at hipflexors.  There are no adventitious movements.  Reflexes:  Are 2+ bilaterally in both the upper and lower extremities except absent at ankles.  Coordination:  Normal finger to nose.    Sensation is decreased to temperature and vibration in feet.  No simultaneous extinction or agraphesthesia.  Gait and Station are slightly unsteady and antalgic.  Tandem gait not tested Romberg is negative.  MRI brain images reviewed and revealed right corona radiata infarct on DWI on MCA distribution on right hemisphere.  Impression/Recs:  Acute ischemic stroke likely artery to artery embolism.  Concern for stenotic right ICA.  She doesn't meet criteria for CEA based on her carotid u/s from my perspective.  However, we need to get a better picture of her stenosis as well as evaluate for intracranial vascular disease.  I agree with the use of clopidogrel.  After we get the MRA of the head and neck we will see her back to see if referral for CEA makes sense.  I am also going to take the liberty of getting peripheral neuropathy labs as she has  an obvious PN, likely from diabetes, but I will check other   Thank you for having [place] see this patient in consultation.  Feel free to contact me with any questions.  [name] Raider Modesto Charon, MD [facility], [place] [address] [place], [place] [address] Phone: [phone] Fax: [phone].

## 2011-02-03 NOTE — Patient Instructions (Signed)
Go to the basement to have your labs drawn today.  Your MRA has been scheduled for Friday, Sept. 21st at 9:00am at [facility].  Please arrive by 8:45am.

## 2011-02-05 NOTE — Consult Note (Signed)
[name], PATRON NO.:  [id number]  MEDICAL RECORD NO.:  [id number]  LOCATION:  [facility]                         FACILITY:  [facility]  PHYSICIAN:  [name] Farr, MD    DATE OF BIRTH:  04/09/1932  DATE OF CONSULTATION:  01/21/2011 DATE OF DISCHARGE:                                CONSULTATION   REASON FOR CONSULTATION:  Stroke.  HISTORY OF PRESENT ILLNESS:  This is a pleasant 77 year old Caucasian female with past medical history of mild aortic stenosis, hypertension, hypercholesterolemia, diabetes, CAD, cardiac stent.  The patient noted approximately 1 week ago that she had intermittent episodes of dysarthria.  Her son is at bedside who states that these episodes would last for approximately 1-3 minutes and then subsides.  This occurred for approximately 1-2 days and then subsided.  Recently, she has been noting some generalized weakness and was brought to the hospital for further evaluation.  The patient was advised to come to the emergency department from family members.  In the emergency department, the patient's MRI did show that she had a small acute, subacute nonhemorrhagic infarct in the right parietal region.  It also showed remote small left thalamic infarct.  Neurology was consulted for further evaluation.  PAST MEDICAL HISTORY:  As noted above.  MEDICATIONS:  The patient is on aspirin 81 mg everyday.  She has recently been written a script from her cardiologist for Plavix, but had not started as of yet, her first dose was to be today.  She is on Lasix, glipizide, quinapril, and labetalol.  ALLERGIES:  SULFA and VICODIN.  SOCIAL HISTORY:  She does not drink, smoke, or use illicit drugs.  She is married.  REVIEW OF SYSTEMS:  Negative with the exception above.  PHYSICAL EXAMINATION:  VITAL SIGNS:  Temperature is 97.8, blood pressure is 188/63, pulse 61, respirations 18. GENERAL:  The patient is alert and oriented x3, carries out 2 and  3-step commands without difficulty. NEUROLOGIC:  Pupils are equal, round, and reactive to light and accommodating.  Conjugate gaze.  Extraocular movements are intact. Visual fields grossly intact.  Face symmetrical.  Tongue is midline. Uvula is midline.  Facial sensation is full.  Shoulder shrug and head turn within normal limits.  Coordination:  Finger-to-nose and heel-to- shin are smooth.  The patient's motor is 5/5.  Deep tendon reflexes 2+ throughout.  Downgoing toes.  The patient shows no drift in the upper or lower extremities. PULMONARY:  Clear to auscultation. CARDIOVASCULAR:  S1 and S2 is audible.  Sensation is full to pinprick, light touch throughout.  LABS:  Sodium is 142, potassium 3.6, chloride 106, BUN is 15, creatinine 0.70, glucose 91.  White blood cell count 8.7, platelets 232, hemoglobin 12.6, hematocrit 37.0.  HbA1c, fasting lipid panel are pending.  MRI brain as noted above.  ASSESSMENT:  This is a 77 year old female with on and off dysarthria for approximately 2 days, each episode lasting for approximately 1-3 minutes.  The patient is now asymptomatic, back to her baseline.  The patient is already on aspirin daily and has recently been started on Plavix by her cardiologist.  Her first dose of Plavix was to be  taken today.  Carotid Doppler showed that she has 40-59% stenosis of her right ICA, but no stenosis on her left.  A 2D echo is pending.  MRI shows small acute/subacute nonhemorrhagic infarct in the right parietal region and a remote left thalamic infarct, most likely cause is artery-to- artery emboli.  RECOMMENDATIONS: 1. Continue with stroke workup, including HbA1c, fasting lipid panel. 2. Would recommend improved blood pressure control as her blood     pressure is 188/63 and 163/73 once out of the acute period. 3. Secondary stroke prevention which includes addressing her diabetes and     hyperlipidemia management as well. 4. Would continue with aspirin  and Plavix.  Dr. Thad Ranger has seen and evaluated the patient and agrees with above mentioned.     [name] Morn, PA-C   ______________________________ [name] Farr, MD    DS/MEDQ  D:  01/21/2011  T:  01/21/2011  Job:  [id number]  Electronically Signed by [name] Morn PA-C on 01/25/2011 10:35:54 AM Electronically Signed by [name] Farr MD on 02/05/2011 10:53:42 AM

## 2011-02-07 ENCOUNTER — Encounter: Payer: Medicare Other | Admitting: Cardiology

## 2011-02-07 LAB — PROTEIN ELECTROPHORESIS, SERUM
Albumin ELP: 54.6 % — ABNORMAL LOW (ref 55.8–66.1)
Alpha-2-Globulin: 12.4 % — ABNORMAL HIGH (ref 7.1–11.8)
Beta Globulin: 6.8 % (ref 4.7–7.2)
Total Protein, Serum Electrophoresis: 8.1 g/dL (ref 6.0–8.3)

## 2011-02-10 ENCOUNTER — Telehealth: Payer: Self-pay

## 2011-02-10 NOTE — Telephone Encounter (Signed)
Pt notified of low B12 and to contact her pcp for possible b12 injections

## 2011-02-10 NOTE — Telephone Encounter (Signed)
Message copied by Lelon Huh on Thu Feb 10, 2011  9:28 AM ------      Message from: Milas Gain      Created: Wed Feb 09, 2011  5:19 PM       Ayla Dunigan, Could you call Ms. Ramnath and ask her to see her PCP to see about getting B12 shots.  Her B12 is slightly low and it may be contributing to some of the numbness in her feet.  If she has any questions I will be glad to call her but otherise I will see her back after her imaging is done.

## 2011-02-11 ENCOUNTER — Ambulatory Visit (HOSPITAL_COMMUNITY)
Admission: RE | Admit: 2011-02-11 | Discharge: 2011-02-11 | Disposition: A | Discharge status: Home / Self Care | Payer: Medicare Other | Source: Ambulatory Visit | Attending: Neurology | Admitting: Neurology

## 2011-02-11 DIAGNOSIS — I639 Cerebral infarction, unspecified: Secondary | ICD-10-CM

## 2011-02-11 DIAGNOSIS — Z8673 Personal history of transient ischemic attack (TIA), and cerebral infarction without residual deficits: Secondary | ICD-10-CM | POA: Insufficient documentation

## 2011-02-11 DIAGNOSIS — I6529 Occlusion and stenosis of unspecified carotid artery: Secondary | ICD-10-CM | POA: Insufficient documentation

## 2011-02-11 DIAGNOSIS — I658 Occlusion and stenosis of other precerebral arteries: Secondary | ICD-10-CM | POA: Insufficient documentation

## 2011-02-11 MED ORDER — GADOBENATE DIMEGLUMINE 529 MG/ML IV SOLN
16.0000 mL | Freq: Once | INTRAVENOUS | Status: AC | PRN
  Administered 2011-02-11: 16 mL via INTRAVENOUS

## 2011-04-22 ENCOUNTER — Ambulatory Visit: Admitting: Ophthalmology

## 2011-04-22 ENCOUNTER — Telehealth: Payer: Self-pay | Admitting: Cardiovascular Disease

## 2011-04-22 DIAGNOSIS — I119 Hypertensive heart disease without heart failure: Secondary | ICD-10-CM

## 2011-04-22 NOTE — Telephone Encounter (Signed)
Per Dr Jens Som, DOD, pt ok to stop Plavix but not aspirin.  Alvino Chapel [facility] was notified and form was faxed back.

## 2011-04-22 NOTE — Telephone Encounter (Signed)
New problem;  Pt need clearance for eye surgery on Wednesday . pls advise on asa & plavix  Stop 3-4 days prior to surgery.

## 2011-04-27 ENCOUNTER — Ambulatory Visit: Admitting: Ophthalmology

## 2011-05-25 ENCOUNTER — Encounter: Payer: Self-pay | Admitting: Cardiovascular Disease

## 2011-07-15 ENCOUNTER — Ambulatory Visit (INDEPENDENT_AMBULATORY_CARE_PROVIDER_SITE_OTHER): Payer: Medicare Other | Admitting: Neurology

## 2011-07-15 ENCOUNTER — Encounter: Payer: Self-pay | Admitting: Neurology

## 2011-07-15 VITALS — BP 138/80 | HR 72 | Ht 59.0 in | Wt 171.0 lb

## 2011-07-15 DIAGNOSIS — I6930 Unspecified sequelae of cerebral infarction: Secondary | ICD-10-CM

## 2011-07-15 DIAGNOSIS — Z8673 Personal history of transient ischemic attack (TIA), and cerebral infarction without residual deficits: Secondary | ICD-10-CM

## 2011-07-15 NOTE — Progress Notes (Signed)
Dear [name],  I saw  [name] [name] back in [facility] clinic for her problem with a right parietal infarct.  As you may recall, she is a 78 y.o. year old female with a history of right parietal/temporal/frontal infarct thought to be secondary to an M2 stenosis.  She also has a peripheral neuropathy by exam, likely secondary to her diabetes.  She remains on aspirin and plavix.  She has had no further events.  Her husband says that at times she will get some facial droop but it seems to correlate with being tired.  She also has problems with her left hand at times.  However, she has had no new events.  I found that her B12 was slightly low at her last visit.  She still has not started supplementation.  Medical history, social history, and family history were reviewed and have not changed since the last clinic visit.  Current Outpatient Prescriptions on File Prior to Visit  Medication Sig Dispense Refill  . aspirin 81 MG tablet Take 81 mg by mouth daily.        . clopidogrel (PLAVIX) 75 MG tablet Take 1 tablet (75 mg total) by mouth daily.  30 tablet  11  . furosemide (LASIX) 40 MG tablet Take 40 mg by mouth daily.       Marland Kitchen glipiZIDE (GLUCOTROL) 10 MG tablet Take 5 mg by mouth daily.       Marland Kitchen labetalol (NORMODYNE) 200 MG tablet Take by mouth. Take 2 tablets twice a day       . quinapril (ACCUPRIL) 40 MG tablet Take 40 mg by mouth at bedtime.          Allergies  Allergen Reactions  . Sulfa Antibiotics     ROS:  13 systems were reviewed and are notable for arthritis of the knees.  All other review of systems are unremarkable.  Exam: . Filed Vitals:   07/15/11 1120  BP: 138/80  Pulse: 72  Height: 4\' 11"  (1.499 m)  Weight: 171 lb (77.565 kg)    In general, tired appearing women.   Cranial Nerves: Pupils are equally round and reactive to light. Visual fields full to confrontation.EOMS reveal end gaze nystagmus. Facial sensation and muscles of mastication are intact. Muscles of  facial expression are symmetric.  Tongue protrusion, uvula, palate midline.  Shoulder shrug intact  Motor:  Normal bulk and tone, no drift and 5/5 muscle strength bilaterally.  Reflexes: 1 + uppers, absent lowers.  Coordination:  Normal finger to nose   Impression/Recommendations:  1. Ischemic stroke - likely due to M2 stenosis.  At this point in time, I think stopping her aspirin and just keeping her on Plavix would be reasonable, but I will leave this up to Dr. Eden Emms and yourself. 2.  Peripheral neuropathy - may be contributed to by B12 deficiency, but likely mostly from diabetes.  Have directed her to take 1mg  oral B12 daily.  She can see me on a PRN basis.  [name] Raider Modesto Charon, MD [facility], [place]

## 2011-10-31 ENCOUNTER — Ambulatory Visit (INDEPENDENT_AMBULATORY_CARE_PROVIDER_SITE_OTHER): Payer: Medicare Other | Admitting: Cardiovascular Disease

## 2011-10-31 ENCOUNTER — Encounter: Payer: Self-pay | Admitting: Cardiovascular Disease

## 2011-10-31 VITALS — BP 179/77 | HR 65 | Wt 175.0 lb

## 2011-10-31 DIAGNOSIS — I350 Nonrheumatic aortic (valve) stenosis: Secondary | ICD-10-CM

## 2011-10-31 DIAGNOSIS — M459 Ankylosing spondylitis of unspecified sites in spine: Secondary | ICD-10-CM

## 2011-10-31 DIAGNOSIS — E7800 Pure hypercholesterolemia, unspecified: Secondary | ICD-10-CM

## 2011-10-31 DIAGNOSIS — I10 Essential (primary) hypertension: Secondary | ICD-10-CM

## 2011-10-31 DIAGNOSIS — I2510 Atherosclerotic heart disease of native coronary artery without angina pectoris: Secondary | ICD-10-CM

## 2011-10-31 DIAGNOSIS — Z8673 Personal history of transient ischemic attack (TIA), and cerebral infarction without residual deficits: Secondary | ICD-10-CM

## 2011-10-31 DIAGNOSIS — E119 Type 2 diabetes mellitus without complications: Secondary | ICD-10-CM

## 2011-10-31 DIAGNOSIS — I6930 Unspecified sequelae of cerebral infarction: Secondary | ICD-10-CM

## 2011-10-31 DIAGNOSIS — I359 Nonrheumatic aortic valve disorder, unspecified: Secondary | ICD-10-CM

## 2011-10-31 NOTE — Patient Instructions (Signed)
Your physician wants you to follow-up in:   6 MONTHS WITH DR NISHAN  You will receive a reminder letter in the mail two months in advance. If you don't receive a letter, please call our office to schedule the follow-up appointment. Your physician recommends that you continue on your current medications as directed. Please refer to the Current Medication list given to you today.  Your physician has requested that you have an echocardiogram. Echocardiography is a painless test that uses sound waves to create images of your heart. It provides your doctor with information about the size and shape of your heart and how well your heart's chambers and valves are working. This procedure takes approximately one hour. There are no restrictions for this procedure. DX AS 

## 2011-10-31 NOTE — Assessment & Plan Note (Signed)
Stable with no angina and good activity level.  Continue medical Rx  

## 2011-10-31 NOTE — Assessment & Plan Note (Signed)
Cholesterol is at goal.  Continue current dose of statin and diet Rx.  No myalgias or side effects.  F/U  LFT's in 6 months.            

## 2011-10-31 NOTE — Assessment & Plan Note (Signed)
No recurrence continue asa and plavix.  F/U [place] [facility] doctor for diploplia

## 2011-10-31 NOTE — Progress Notes (Signed)
Patient ID: [name] [name], female   DOB: [birth year], 78 y.o.   MRN: [id number] [name] [name] is seen today for F/U of CAD, AS and risk factors. Unfortunately her husband of 55 years died in 09-02-2022. He had COPD. She has a son near by but has difficulty with being alone. He last echo in 2009 showed mild AS with gradients of 40mmHg/24mmHg. She has a distant history of CAD. She had PCI of the LAD by Dr. Tresa Endo in 1996, and stenting of the ostial RCA and circ by Dr. Primitivo Gauze in 1997. She had a nonischemic myovue in January of 2009. She is not having any SSCP but is sedentary. She is tolerating low dose Crestor. There has been no SSCP, PND, orthopnea, edema or palptations.  Has had 3 episodes of slurred speech 2012 . One related to coca-cola and aleve. No other arm/leg weakness. Resolved in minutes. Son took her to firehouse one time and vitals normal. Reviewed ECG done there and it was normal. Not associated with palpitations  Thought due to M2 stenosis and followed by Dr Modesto Charon.  On dual antiplatlet Rx and no recurrence Has some chronic visual issues and seeing an ophthamologist in [place].  Mild diploplia from weak extraoccular muscles.  No longer driving but can read Echo 09/30/10  - Left ventricle: The cavity size was normal. There was mild concentric hypertrophy. Systolic function was normal. The estimated ejection fraction was in the range of 55% to 60%. Wall motion was normal; there were no regional wall motion abnormalities. Doppler parameters are consistent with abnormal left ventricular relaxation (grade 1 diastolic dysfunction). - Aortic valve: There was moderate stenosis. Trivial regurgitation. Mean gradient: 22mm Hg (S). Peak gradient: 38mm Hg (S). - Left atrium: The atrium was mildly dilated.  ROS: Denies fever, malais, weight loss, blurry vision, decreased visual acuity, cough, sputum, SOB, hemoptysis, pleuritic pain, palpitaitons, heartburn, abdominal pain, melena, lower extremity edema, claudication, or  rash.  All other systems reviewed and negative  General: Affect appropriate Obese white female HEENT: normal Neck supple with no adenopathy JVP normal no bruits no thyromegaly Lungs clear with no wheezing and good diaphragmatic motion Heart:  S1/S2 AS  murmur, no rub, gallop or click PMI normal Abdomen: benighn, BS positve, no tenderness, no AAA no bruit.  No HSM or HJR Distal pulses intact with no bruits No edema Neuro non-focal Skin warm and dry No muscular weakness   Current Outpatient Prescriptions  Medication Sig Dispense Refill  . aspirin 81 MG tablet Take 81 mg by mouth daily.        . clopidogrel (PLAVIX) 75 MG tablet Take 1 tablet (75 mg total) by mouth daily.  30 tablet  11  . cyanocobalamin 500 MCG tablet Take 500 mcg by mouth daily.      . fish oil-omega-3 fatty acids 1000 MG capsule Take 1 g by mouth daily.      . furosemide (LASIX) 40 MG tablet Take 40 mg by mouth daily.       Marland Kitchen glipiZIDE (GLUCOTROL) 10 MG tablet Take 5 mg by mouth daily.       Marland Kitchen labetalol (NORMODYNE) 200 MG tablet Take 2 tablets twice a day      . Multiple Vitamin (MULTIVITAMIN) tablet Take 1 tablet by mouth daily.      . quinapril (ACCUPRIL) 40 MG tablet Take 40 mg by mouth at bedtime.          Allergies  Sulfa antibiotics  Electrocardiogram:  Assessment and Plan

## 2011-10-31 NOTE — Assessment & Plan Note (Signed)
Well controlled.  Continue current medications and low sodium Dash type diet.    

## 2011-10-31 NOTE — Assessment & Plan Note (Signed)
Moderate by exam and echo. F/U echo this month.  Is not an ideal candidate for open surgery or TAVR

## 2011-10-31 NOTE — Assessment & Plan Note (Signed)
Discussed low carb diet.  Target hemoglobin A1c is 6.5 or less.  Continue current medications.  

## 2011-11-03 ENCOUNTER — Ambulatory Visit: Payer: Medicare Other | Admitting: Cardiovascular Disease

## 2011-11-14 ENCOUNTER — Ambulatory Visit (HOSPITAL_COMMUNITY): Payer: Medicare Other | Attending: Cardiology

## 2011-11-14 DIAGNOSIS — Z8673 Personal history of transient ischemic attack (TIA), and cerebral infarction without residual deficits: Secondary | ICD-10-CM | POA: Insufficient documentation

## 2011-11-14 DIAGNOSIS — I359 Nonrheumatic aortic valve disorder, unspecified: Secondary | ICD-10-CM | POA: Insufficient documentation

## 2011-11-14 DIAGNOSIS — I350 Nonrheumatic aortic (valve) stenosis: Secondary | ICD-10-CM

## 2011-11-14 DIAGNOSIS — E785 Hyperlipidemia, unspecified: Secondary | ICD-10-CM | POA: Insufficient documentation

## 2011-11-14 DIAGNOSIS — I2510 Atherosclerotic heart disease of native coronary artery without angina pectoris: Secondary | ICD-10-CM | POA: Insufficient documentation

## 2011-11-14 DIAGNOSIS — I517 Cardiomegaly: Secondary | ICD-10-CM | POA: Insufficient documentation

## 2011-11-14 DIAGNOSIS — E119 Type 2 diabetes mellitus without complications: Secondary | ICD-10-CM | POA: Insufficient documentation

## 2011-11-14 DIAGNOSIS — I10 Essential (primary) hypertension: Secondary | ICD-10-CM | POA: Insufficient documentation

## 2011-11-14 DIAGNOSIS — I059 Rheumatic mitral valve disease, unspecified: Secondary | ICD-10-CM | POA: Insufficient documentation

## 2011-11-14 NOTE — Progress Notes (Signed)
Echocardiogram performed.  

## 2011-12-28 ENCOUNTER — Telehealth: Payer: Self-pay | Admitting: *Deleted

## 2011-12-28 NOTE — Telephone Encounter (Signed)
ERROR./CY 

## 2012-01-12 ENCOUNTER — Encounter: Payer: Self-pay | Admitting: Cardiovascular Disease

## 2012-01-16 ENCOUNTER — Telehealth: Payer: Self-pay | Admitting: Cardiovascular Disease

## 2012-01-16 NOTE — Telephone Encounter (Signed)
Pt wanted to talk to you about finding a good internal med provider in [place]

## 2012-01-17 NOTE — Telephone Encounter (Signed)
Left message for pt to call.

## 2012-01-17 NOTE — Telephone Encounter (Signed)
Pt returning nurse call, she can be reached at hm#  °

## 2012-01-18 MED ORDER — CLOPIDOGREL BISULFATE 75 MG PO TABS: 75.0000 mg | ORAL_TABLET | Freq: Every day | ORAL | Status: DC

## 2012-01-18 MED ORDER — QUINAPRIL HCL 40 MG PO TABS: 40.0000 mg | ORAL_TABLET | Freq: Every day | ORAL | Status: DC

## 2012-01-18 NOTE — Telephone Encounter (Signed)
Spoke with pt, she is wanting a PCP that is closer to her home. She will get in touch with the [place] office at [facility] to try to get established.

## 2012-05-25 ENCOUNTER — Telehealth: Payer: Self-pay | Admitting: Cardiovascular Disease

## 2012-05-25 MED ORDER — FUROSEMIDE 40 MG PO TABS: 40.0000 mg | ORAL_TABLET | Freq: Every day | ORAL | Status: DC

## 2012-05-25 NOTE — Telephone Encounter (Signed)
Med refilled per pt request.  

## 2012-05-25 NOTE — Telephone Encounter (Signed)
New Problem:    Patient called in needing a prescription for her furosemide (LASIX) 40 MG tablet sent in to the pharmacy listed on file.  Patient needed enough called in to last her til her appointment to see Dr. Eden Emms.  Please call back if you have any questions.

## 2012-06-06 ENCOUNTER — Ambulatory Visit (INDEPENDENT_AMBULATORY_CARE_PROVIDER_SITE_OTHER): Payer: Medicare Other | Admitting: Cardiovascular Disease

## 2012-06-06 ENCOUNTER — Encounter: Payer: Self-pay | Admitting: Cardiovascular Disease

## 2012-06-06 VITALS — BP 148/66 | HR 61 | Resp 18 | Ht 59.0 in | Wt 167.0 lb

## 2012-06-06 DIAGNOSIS — I359 Nonrheumatic aortic valve disorder, unspecified: Secondary | ICD-10-CM

## 2012-06-06 DIAGNOSIS — I2510 Atherosclerotic heart disease of native coronary artery without angina pectoris: Secondary | ICD-10-CM

## 2012-06-06 DIAGNOSIS — I350 Nonrheumatic aortic (valve) stenosis: Secondary | ICD-10-CM

## 2012-06-06 DIAGNOSIS — I10 Essential (primary) hypertension: Secondary | ICD-10-CM

## 2012-06-06 DIAGNOSIS — E7800 Pure hypercholesterolemia, unspecified: Secondary | ICD-10-CM

## 2012-06-06 NOTE — Patient Instructions (Addendum)
Your physician wants you to follow-up in: 1 year with Dr. Eden Emms.  You will receive a reminder letter in the mail two months in advance. If you don't receive a letter, please call our office to schedule the follow-up appointment.  Your physician has requested that you have an echocardiogram in June 2014. Echocardiography is a painless test that uses sound waves to create images of your heart. It provides your doctor with information about the size and shape of your heart and how well your heart's chambers and valves are working. This procedure takes approximately one hour. There are no restrictions for this procedure.

## 2012-06-06 NOTE — Assessment & Plan Note (Signed)
Stable asymptomatic.  May end of being a TAVR candidate in future F/U echo 6/14

## 2012-06-06 NOTE — Assessment & Plan Note (Signed)
Stable with no angina and good activity level.  Continue medical Rx  

## 2012-06-06 NOTE — Progress Notes (Signed)
Patient ID: [name] [name], female   DOB: 1932/03/23, 79 y.o.   MRN: [id number] [name] [name] is seen today for F/U of CAD, AS and risk factors. Unfortunately her husband of 55 years died in 2022-08-26. He had COPD. She has a son near by but has difficulty with being alone. He last echo in 2009 showed mild AS with gradients of 15mmHg/24mmHg. She has a distant history of CAD. She had PCI of the LAD by Dr. Tresa [name] in 1996, and stenting of the ostial RCA and circ by Dr. Primitivo [name] in 1997. She had a nonischemic myovue in January of 2009. She is not having any SSCP but is sedentary. She is tolerating low dose Crestor. There has been no SSCP, PND, orthopnea, edema or palptations.   Having difficulty with eyes Seeing eye doctor in [place]  Wants second opinion and recommended Dr [name] [name] at [place].  Has had cataracts removed and has "some weak muscles"  Has had 3 episodes of slurred speech 2012 . One related to coca-cola and aleve. No other arm/leg weakness. Resolved in minutes. Son took her to firehouse one time and vitals normal. Reviewed ECG done there and it was normal. Not associated with palpitations Thought due to M2 stenosis and followed by Dr [name] [name]. On dual antiplatlet Rx and no recurrence  Has some chronic visual issues and seeing an ophthamologist in [place]. Mild diploplia from weak extraoccular muscles. No longer driving but can read  Echo 11/14/11 Study Conclusions  - Left ventricle: The cavity size was normal. Wall thickness was increased in a pattern of moderate LVH. There was mild focal basal hypertrophy of the septum. Systolic function was normal. The estimated ejection fraction was in the range of 55% to 60%. There is hypokinesis of the mid-distal septal myocardium. Doppler parameters are consistent with high ventricular filling pressure. - Aortic valve: Valve mobility was restricted. There was moderate stenosis. Trivial regurgitation. - Mitral valve: Calcified annulus. Mild regurgitation. - Left atrium: The atrium  was moderately dilated. - Pulmonary arteries: Systolic pressure was mildly increased. PA peak pressure: 40mm Hg (S).  Gradients stable  Mean gradient 22 to 24mm Hg and peak 38 to 43 mmHg from 09/30/10 to 11/14/11  ROS: Denies fever, malais, weight loss, blurry vision, decreased visual acuity, cough, sputum, SOB, hemoptysis, pleuritic pain, palpitaitons, heartburn, abdominal pain, melena, lower extremity edema, claudication, or rash.  All other systems reviewed and negative  General: Affect appropriate Overweight white female HEENT: normal Neck supple with no adenopathy JVP normal no bruits no thyromegaly Lungs clear with no wheezing and good diaphragmatic motion Heart:  S1/S2 preserved moderate AS  murmur, no rub, gallop or click PMI normal Abdomen: benighn, BS positve, no tenderness, no AAA no bruit.  No HSM or HJR Distal pulses intact with no bruits No edema Neuro non-focal Skin warm and dry No muscular weakness   Current Outpatient Prescriptions  Medication Sig Dispense Refill  . aspirin 81 MG tablet Take 81 mg by mouth daily.        . clopidogrel (PLAVIX) 75 MG tablet Take 1 tablet (75 mg total) by mouth daily.  90 tablet  4  . cyanocobalamin 500 MCG tablet Take 500 mcg by mouth daily.      . fish oil-omega-3 fatty acids 1000 MG capsule Take 1 g by mouth daily.      . furosemide (LASIX) 40 MG tablet Take 1 tablet (40 mg total) by mouth daily.  30 tablet  2  . glipiZIDE (GLUCOTROL) 10 MG tablet Take 5  mg by mouth daily.       Marland Kitchen labetalol (NORMODYNE) 200 MG tablet Take 2 tablets twice a day      . Multiple Vitamin (MULTIVITAMIN) tablet Take 1 tablet by mouth daily.      . quinapril (ACCUPRIL) 40 MG tablet Take 1 tablet (40 mg total) by mouth at bedtime.  90 tablet  4    Allergies  Sulfa antibiotics  Electrocardiogram:  02/05/11 SR rate 51 nonspecific ST/T wave changes  Today SR rate 56 LVH T wave inversion 4, F  Assessment and Plan

## 2012-06-06 NOTE — Assessment & Plan Note (Signed)
Well controlled.  Continue current medications and low sodium Dash type diet.    

## 2012-06-06 NOTE — Assessment & Plan Note (Signed)
Cholesterol is at goal.  Continue current dose of statin and diet Rx.  No myalgias or side effects.  F/U  LFT's in 6 months. Lab Results  Component Value Date   LDLCALC 179* 01/21/2011

## 2012-06-11 NOTE — Addendum Note (Signed)
Addended by: Reine Just on: 06/11/2012 03:05 PM   Modules accepted: Orders

## 2012-07-31 ENCOUNTER — Telehealth: Payer: Self-pay | Admitting: Cardiovascular Disease

## 2012-07-31 MED ORDER — LABETALOL HCL 200 MG PO TABS: 200.0000 mg | ORAL_TABLET | Freq: Two times a day (BID) | ORAL | Status: DC

## 2012-07-31 MED ORDER — QUINAPRIL HCL 40 MG PO TABS: 40.0000 mg | ORAL_TABLET | Freq: Every day | ORAL | Status: DC

## 2012-07-31 NOTE — Telephone Encounter (Signed)
Pt needs Rx written and mailed to her she needs quinapril hci 40mg  qd, Ladetalol hcl 200mg  2pills bid and she needs 90 day supply and

## 2012-07-31 NOTE — Telephone Encounter (Signed)
RX DONE AWAITING MD SIGNATURE   THEN WILL MAIL TO PT . PT AWARE ./CY

## 2012-08-07 ENCOUNTER — Telehealth: Payer: Self-pay | Admitting: Cardiovascular Disease

## 2012-08-07 NOTE — Telephone Encounter (Signed)
PT AWARE SCRIPT MAILED TO PT ON Thursday  08-02-12 INFORMED PT  THAT IF DID NOT RECEIVE BY  Friday TO CALL AND LEAVE MESSAGE AND I WILL  GET NEW SCRIPTS  SIGNED AND SHE CAN PICK THEM UP AT OFFICE  VERBALIZED UNDERSTANDING .Zack Seal

## 2012-08-07 NOTE — Telephone Encounter (Signed)
New problem    Pt stated last week Dr Eden Emms stated he would put 2 prescription in the mail so she could send it to [place]. Pt hasn't received it yet. Pt didn't have name of medications but stated nurse knew what they were.

## 2012-08-30 ENCOUNTER — Telehealth: Payer: Self-pay | Admitting: Cardiovascular Disease

## 2012-08-30 MED ORDER — FUROSEMIDE 40 MG PO TABS: 40.0000 mg | ORAL_TABLET | Freq: Every day | ORAL | Status: DC

## 2012-08-30 NOTE — Telephone Encounter (Signed)
REFILL REQUEST SENT VIA EPIC .[name] [name]

## 2012-08-30 NOTE — Telephone Encounter (Signed)
New problem   Pt need new prescription for Furosemide 40mg  generic for Lasix. [facility] [phone]

## 2012-10-18 ENCOUNTER — Other Ambulatory Visit: Payer: Self-pay | Admitting: Cardiology

## 2012-10-18 ENCOUNTER — Other Ambulatory Visit: Payer: Self-pay | Admitting: *Deleted

## 2012-10-18 MED ORDER — FUROSEMIDE 40 MG PO TABS: 40.0000 mg | ORAL_TABLET | Freq: Every day | ORAL | Status: DC

## 2012-10-18 MED ORDER — QUINAPRIL HCL 40 MG PO TABS: 40.0000 mg | ORAL_TABLET | Freq: Every day | ORAL | Status: DC

## 2012-10-18 MED ORDER — GLIPIZIDE 10 MG PO TABS: 5.0000 mg | ORAL_TABLET | Freq: Every day | ORAL | Status: DC

## 2012-11-05 ENCOUNTER — Ambulatory Visit (HOSPITAL_COMMUNITY): Payer: Medicare Other | Attending: Cardiovascular Disease | Admitting: Radiology

## 2012-11-05 ENCOUNTER — Telehealth: Payer: Self-pay | Admitting: Cardiovascular Disease

## 2012-11-05 DIAGNOSIS — E669 Obesity, unspecified: Secondary | ICD-10-CM | POA: Insufficient documentation

## 2012-11-05 DIAGNOSIS — I359 Nonrheumatic aortic valve disorder, unspecified: Secondary | ICD-10-CM | POA: Insufficient documentation

## 2012-11-05 DIAGNOSIS — E785 Hyperlipidemia, unspecified: Secondary | ICD-10-CM | POA: Insufficient documentation

## 2012-11-05 DIAGNOSIS — I350 Nonrheumatic aortic (valve) stenosis: Secondary | ICD-10-CM

## 2012-11-05 DIAGNOSIS — I10 Essential (primary) hypertension: Secondary | ICD-10-CM | POA: Insufficient documentation

## 2012-11-05 DIAGNOSIS — I2510 Atherosclerotic heart disease of native coronary artery without angina pectoris: Secondary | ICD-10-CM | POA: Insufficient documentation

## 2012-11-05 NOTE — Telephone Encounter (Signed)
Walk in Pt Form " Pt Needs Rx's Filled" sent to Message Nurse 11/05/12/KM

## 2012-11-05 NOTE — Progress Notes (Signed)
Echocardiogram performed.  

## 2012-11-06 ENCOUNTER — Other Ambulatory Visit: Payer: Self-pay | Admitting: *Deleted

## 2012-11-06 MED ORDER — QUINAPRIL HCL 40 MG PO TABS: 40.0000 mg | ORAL_TABLET | Freq: Every day | ORAL | Status: DC

## 2012-11-06 MED ORDER — LABETALOL HCL 200 MG PO TABS: 200.0000 mg | ORAL_TABLET | Freq: Two times a day (BID) | ORAL | Status: DC

## 2012-11-07 ENCOUNTER — Telehealth: Payer: Self-pay | Admitting: *Deleted

## 2012-11-07 NOTE — Telephone Encounter (Signed)
Pt needs a written prescription for Labetalol her son will pick it up. Please call him when ready

## 2013-01-15 ENCOUNTER — Other Ambulatory Visit: Payer: Self-pay | Admitting: Cardiovascular Disease

## 2013-04-15 ENCOUNTER — Telehealth: Payer: Self-pay | Admitting: *Deleted

## 2013-04-15 NOTE — Telephone Encounter (Signed)
PT  AWARE NEEDS  ECHO    ECHO SCHEDULED  FOR  05-09-13  AT  10:30 WITH F/U  AFTER./CY

## 2013-05-09 ENCOUNTER — Ambulatory Visit (INDEPENDENT_AMBULATORY_CARE_PROVIDER_SITE_OTHER): Payer: Medicare Other | Admitting: Cardiovascular Disease

## 2013-05-09 ENCOUNTER — Other Ambulatory Visit (HOSPITAL_COMMUNITY): Payer: Self-pay | Admitting: Radiology

## 2013-05-09 ENCOUNTER — Other Ambulatory Visit: Payer: Self-pay | Admitting: *Deleted

## 2013-05-09 ENCOUNTER — Encounter: Payer: Self-pay | Admitting: Cardiovascular Disease

## 2013-05-09 ENCOUNTER — Ambulatory Visit (HOSPITAL_COMMUNITY): Payer: Medicare Other | Attending: Cardiology | Admitting: Radiology

## 2013-05-09 ENCOUNTER — Encounter: Payer: Self-pay | Admitting: *Deleted

## 2013-05-09 VITALS — BP 150/70 | HR 60 | Ht 59.0 in | Wt 158.0 lb

## 2013-05-09 DIAGNOSIS — I2510 Atherosclerotic heart disease of native coronary artery without angina pectoris: Secondary | ICD-10-CM

## 2013-05-09 DIAGNOSIS — I10 Essential (primary) hypertension: Secondary | ICD-10-CM | POA: Insufficient documentation

## 2013-05-09 DIAGNOSIS — I059 Rheumatic mitral valve disease, unspecified: Secondary | ICD-10-CM | POA: Insufficient documentation

## 2013-05-09 DIAGNOSIS — R0609 Other forms of dyspnea: Secondary | ICD-10-CM

## 2013-05-09 DIAGNOSIS — I359 Nonrheumatic aortic valve disorder, unspecified: Secondary | ICD-10-CM | POA: Insufficient documentation

## 2013-05-09 DIAGNOSIS — I350 Nonrheumatic aortic (valve) stenosis: Secondary | ICD-10-CM

## 2013-05-09 DIAGNOSIS — E785 Hyperlipidemia, unspecified: Secondary | ICD-10-CM | POA: Insufficient documentation

## 2013-05-09 DIAGNOSIS — J449 Chronic obstructive pulmonary disease, unspecified: Secondary | ICD-10-CM | POA: Insufficient documentation

## 2013-05-09 DIAGNOSIS — J4489 Other specified chronic obstructive pulmonary disease: Secondary | ICD-10-CM | POA: Insufficient documentation

## 2013-05-09 DIAGNOSIS — R0600 Dyspnea, unspecified: Secondary | ICD-10-CM

## 2013-05-09 MED ORDER — LABETALOL HCL 200 MG PO TABS: ORAL_TABLET | ORAL | Status: DC

## 2013-05-09 MED ORDER — CLOPIDOGREL BISULFATE 75 MG PO TABS: 75.0000 mg | ORAL_TABLET | Freq: Every day | ORAL | Status: DC

## 2013-05-09 MED ORDER — LABETALOL HCL 200 MG PO TABS: 200.0000 mg | ORAL_TABLET | Freq: Two times a day (BID) | ORAL | Status: DC

## 2013-05-09 NOTE — Assessment & Plan Note (Signed)
Moderate with preserved S2 and no change in gradients by echo F/U echo in a year

## 2013-05-09 NOTE — Assessment & Plan Note (Signed)
Limited walking due to arthritis  New RWMA on echo with some dyspnea Given concomitant AS favor myovue to risk stratify

## 2013-05-09 NOTE — Patient Instructions (Signed)
Your physician wants you to follow-up in:   6 MONTHS WITH DR NISHAN' You will receive a reminder letter in the mail two months in advance. If you don't receive a letter, please call our office to schedule the follow-up appointment. Your physician recommends that you continue on your current medications as directed. Please refer to the Current Medication list given to you today.   Your physician has requested that you have a lexiscan myoview. For further information please visit [email or web address]. Please follow instruction sheet, as given.  

## 2013-05-09 NOTE — Assessment & Plan Note (Signed)
Cholesterol is at goal.  Continue current dose of statin and diet Rx.  No myalgias or side effects.  F/U  LFT's in 6 months. Lab Results  Component Value Date   LDLCALC 179* 01/21/2011  More recent labs with primary

## 2013-05-09 NOTE — Progress Notes (Signed)
Patient ID: [name] [name], female   DOB: 12-Sep-1931, 79 y.o.   MRN: [id number] [name] [name] is seen today for F/U of CAD, AS and risk factors. Unfortunately her husband of 55 years died in 08/18/22. He had COPD. She has a son near by but has difficulty with being alone. He last echo in 2009 showed mild AS with gradients of 25mmHg/24mmHg. She has a distant history of CAD. She had PCI of the LAD by Dr. Tresa Endo in 1996, and stenting of the ostial RCA and circ by Dr. Primitivo Gauze in 1997. She had a nonischemic myovue in January of 2009. She is not having any SSCP but is sedentary. She is tolerating low dose Crestor. There has been no SSCP, PND, orthopnea, edema or palptations.  Having difficulty with eyes Seeing eye doctor in [place] Wants second opinion and recommended Dr Jettie Pagan at [place]. Has had cataracts removed and has "some weak muscles"  Has had 3 episodes of slurred speech 2012 . One related to coca-cola and aleve. No other arm/leg weakness. Resolved in minutes. Son took her to firehouse one time and vitals normal. Reviewed ECG done there and it was normal. Not associated with palpitations Thought due to M2 stenosis and followed by Dr Modesto Charon. On dual antiplatlet Rx and no recurrence  Has some chronic visual issues and seeing an ophthamologist in [place]. Mild diploplia from weak extraoccular muscles. No longer driving but can read  Echo 11/14/11  Study Conclusions  - Left ventricle: The cavity size was normal. Wall thickness was increased in a pattern of moderate LVH. There was mild focal basal hypertrophy of the septum. Systolic function was normal. The estimated ejection fraction was in the range of 55% to 60%. There is hypokinesis of the mid-distal septal myocardium. Doppler parameters are consistent with high ventricular filling pressure. - Aortic valve: Valve mobility was restricted. There was moderate stenosis. Trivial regurgitation. - Mitral valve: Calcified annulus. Mild regurgitation. - Left atrium: The atrium was  moderately dilated. - Pulmonary arteries: Systolic pressure was mildly increased. PA peak pressure: 40mm Hg (S).  Echo today moderate LVH mean gradient about 22  Distal septum is severely hypokinetic  Gradients stable Mean gradient 22 to 24mm Hg and peak 38 to 43 mmHg from 09/30/10 to 11/14/11      ROS: Denies fever, malais, weight loss, blurry vision, decreased visual acuity, cough, sputum, SOB, hemoptysis, pleuritic pain, palpitaitons, heartburn, abdominal pain, melena, lower extremity edema, claudication, or rash.  All other systems reviewed and negative  General: Affect appropriate Overweight white female  HEENT: normal Neck supple with no adenopathy JVP normal no bruits no thyromegaly Lungs clear with no wheezing and good diaphragmatic motion Heart:  S1/S2 preserved with AS murmur, no rub, gallop or click PMI normal Abdomen: benighn, BS positve, no tenderness, no AAA no bruit.  No HSM or HJR Distal pulses intact with no bruits No edema Neuro non-focal Skin warm and dry No muscular weakness   Current Outpatient Prescriptions  Medication Sig Dispense Refill  . aspirin 81 MG tablet Take 81 mg by mouth daily.        . clopidogrel (PLAVIX) 75 MG tablet TAKE 1 TABLET (75 MG TOTAL) BY MOUTH DAILY.  30 tablet  10  . fish oil-omega-3 fatty acids 1000 MG capsule Take 1 g by mouth daily.      . furosemide (LASIX) 40 MG tablet Take 1 tablet (40 mg total) by mouth daily.  30 tablet  6  . glipiZIDE (GLUCOTROL) 10 MG tablet Take 0.5  tablets (5 mg total) by mouth daily.  30 tablet  6  . labetalol (NORMODYNE) 200 MG tablet Take 1 tablet (200 mg total) by mouth 2 (two) times daily. Take 2 tablets twice a day  360 tablet  3  . Multiple Vitamin (MULTIVITAMIN) tablet Take 1 tablet by mouth daily.      . quinapril (ACCUPRIL) 40 MG tablet Take 1 tablet (40 mg total) by mouth at bedtime.  90 tablet  1   No current facility-administered medications for this visit.    Allergies  Sulfa  antibiotics  Electrocardiogram:  SR LVH   Assessment and Plan

## 2013-05-09 NOTE — Assessment & Plan Note (Signed)
Discussed low carb diet.  Target hemoglobin A1c is 6.5 or less.  Continue current medications.  

## 2013-05-09 NOTE — Assessment & Plan Note (Signed)
Well controlled.  Continue current medications and low sodium Dash type diet.    

## 2013-05-09 NOTE — Progress Notes (Signed)
Echocardiogram performed.  

## 2013-05-14 ENCOUNTER — Other Ambulatory Visit: Payer: Self-pay

## 2013-05-14 MED ORDER — LABETALOL HCL 200 MG PO TABS: 200.0000 mg | ORAL_TABLET | Freq: Two times a day (BID) | ORAL | Status: DC

## 2013-05-20 ENCOUNTER — Other Ambulatory Visit: Payer: Self-pay | Admitting: *Deleted

## 2013-05-20 ENCOUNTER — Telehealth: Payer: Self-pay | Admitting: *Deleted

## 2013-05-20 MED ORDER — LABETALOL HCL 200 MG PO TABS: 200.0000 mg | ORAL_TABLET | Freq: Two times a day (BID) | ORAL | Status: DC

## 2013-05-20 MED ORDER — CLOPIDOGREL BISULFATE 75 MG PO TABS: 75.0000 mg | ORAL_TABLET | Freq: Every day | ORAL | Status: DC

## 2013-05-20 NOTE — Telephone Encounter (Signed)
SPOKE WITH PT  RECEIVED   REFILLS ./CY

## 2013-05-28 ENCOUNTER — Encounter: Payer: Self-pay | Admitting: Cardiovascular Disease

## 2013-05-30 ENCOUNTER — Encounter (HOSPITAL_COMMUNITY): Payer: Medicare Other

## 2013-06-20 ENCOUNTER — Encounter: Payer: Self-pay | Admitting: Cardiology

## 2013-06-20 ENCOUNTER — Ambulatory Visit (HOSPITAL_COMMUNITY): Payer: Medicare Other | Attending: Cardiology | Admitting: Radiology

## 2013-06-20 VITALS — BP 143/60 | Ht 59.0 in | Wt 158.0 lb

## 2013-06-20 DIAGNOSIS — I359 Nonrheumatic aortic valve disorder, unspecified: Secondary | ICD-10-CM | POA: Insufficient documentation

## 2013-06-20 DIAGNOSIS — I350 Nonrheumatic aortic (valve) stenosis: Secondary | ICD-10-CM

## 2013-06-20 DIAGNOSIS — R0609 Other forms of dyspnea: Secondary | ICD-10-CM | POA: Insufficient documentation

## 2013-06-20 DIAGNOSIS — R0600 Dyspnea, unspecified: Secondary | ICD-10-CM

## 2013-06-20 DIAGNOSIS — R0602 Shortness of breath: Secondary | ICD-10-CM

## 2013-06-20 DIAGNOSIS — I2510 Atherosclerotic heart disease of native coronary artery without angina pectoris: Secondary | ICD-10-CM

## 2013-06-20 DIAGNOSIS — R0989 Other specified symptoms and signs involving the circulatory and respiratory systems: Secondary | ICD-10-CM | POA: Insufficient documentation

## 2013-06-20 MED ORDER — TECHNETIUM TC 99M SESTAMIBI GENERIC - CARDIOLITE
30.0000 | Freq: Once | INTRAVENOUS | Status: AC | PRN
  Administered 2013-06-20: 30 via INTRAVENOUS

## 2013-06-20 MED ORDER — TECHNETIUM TC 99M SESTAMIBI GENERIC - CARDIOLITE
10.0000 | Freq: Once | INTRAVENOUS | Status: AC | PRN
  Administered 2013-06-20: 10 via INTRAVENOUS

## 2013-06-20 MED ORDER — REGADENOSON 0.4 MG/5ML IV SOLN
0.4000 mg | Freq: Once | INTRAVENOUS | Status: AC
  Administered 2013-06-20: 0.4 mg via INTRAVENOUS

## 2013-06-20 NOTE — Progress Notes (Signed)
[facility] SITE 3 NUCLEAR MED [address] [place], [place] [address] [phone]    Cardiology Nuclear Med Study  [name] [name] is a 80 y.o. female     MRN : [id number]     DOB: May 31, 1931  Procedure Date: 06/20/2013  Nuclear Med Background Indication for Stress Test:  Evaluation for Ischemia, Stent Patency, PTCA Patency and Abnormal Echo History:  COPD and '13 ECHO: EF: 55-60% modereate Stenosis '09 MPI: NL Cath-Stent multiple Cardiac Risk Factors: CVA, Hypertension, Lipids and NIDDM  Symptoms:  SOB   Nuclear Pre-Procedure Caffeine/Decaff Intake:  None NPO After: 8:00pm   Lungs:  clear O2 Sat: 97% on room air. IV 0.9% NS with Angio Cath:  22g  IV Site: R Antecubital  IV Started by:  Bonnita Levan, RN  Chest Size (in):  36 Cup Size: D  Height: 4\' 11"  (1.499 m)  Weight:  158 lb (71.668 kg)  BMI:  Body mass index is 31.89 kg/(m^2). Tech Comments:  N/A    Nuclear Med Study 1 or 2 day study: 1 day  Stress Test Type:  Lexiscan  Reading MD: N/A  Order Authorizing Provider:  Charlton Haws, MD  Resting Radionuclide: Technetium 44m Sestamibi  Resting Radionuclide Dose: 11.0 mCi   Stress Radionuclide:  Technetium 73m Sestamibi  Stress Radionuclide Dose: 33.0 mCi           Stress Protocol Rest HR: 60 Stress HR: 81  Rest BP: 143/60 Stress BP: 134/56  Exercise Time (min): n/a METS: n/a   Predicted Max HR: 141 bpm % Max HR: 57.45 bpm Rate Pressure Product: 21308   Dose of Adenosine (mg):  n/a Dose of Lexiscan: 0.4 mg  Dose of Atropine (mg): n/a Dose of Dobutamine: n/a mcg/kg/min (at max HR)  Stress Test Technologist: Milana Na, EMT-P  Nuclear Technologist:  Domenic Polite, CNMT     Rest Procedure:  Myocardial perfusion imaging was performed at rest 45 minutes following the intravenous administration of Technetium 54m Sestamibi. Rest ECG: NSR - Normal EKG  Stress Procedure:  The patient received IV Lexiscan 0.4 mg over 15-seconds.  Technetium 70m Sestamibi  injected at 30-seconds. This patient had a headache with the Lexiscan injection. Quantitative spect images were obtained after a 45 minute delay. Stress ECG: No significant change from baseline ECG  QPS Raw Data Images:  There is  interference from nuclear activity from structures below the diaphragm.   This may affect the intrrepretation of the results.  Stress Images:  There is a large, severe defect in the apex with normal uptake in the other areas.   Rest Images:  There is a large, severe defect in the apex with normal uptake in the other areas. Subtraction (SDS):  There is a large, severe apical scar with no evidence of ischemia.  Transient Ischemic Dilatation (Normal <1.22):  1.17 Lung/Heart Ratio (Normal <0.45):  .31  Quantitative Gated Spect Images QGS EDV:  142 ml QGS ESV:  79 ml  Impression Exercise Capacity:  Lexiscan with no exercise. BP Response:  Normal blood pressure response. Clinical Symptoms:  No significant symptoms noted. ECG Impression:  No significant ST segment change suggestive of ischemia. Comparison with Prior Nuclear Study: No images to compare  Overall Impression:  High risk stress nuclear study .  The apical defect appears to be new.  I cannot rule out artifact caused by uptake in structures below the diaphragm.  The LV function is mildly depressed with severe hypokinesis of the apex.       Marland Kitchen  LV Ejection Fraction: 45%.  LV Wall Motion:  moderate LV dysfunction with severe hypokinesis of the apex.     Vesta MixerPhilip J. Muaaz Brau, Montez HagemanJr., MD, [facility] Hospital[facility]ACC 06/20/2013, 5:42 PM Office - [phone] Pager [phone]

## 2013-06-25 ENCOUNTER — Encounter: Payer: Self-pay | Admitting: *Deleted

## 2013-06-28 ENCOUNTER — Encounter (HOSPITAL_COMMUNITY): Payer: Self-pay | Admitting: Pharmacy Technician

## 2013-07-01 ENCOUNTER — Other Ambulatory Visit: Payer: Self-pay | Admitting: *Deleted

## 2013-07-01 ENCOUNTER — Other Ambulatory Visit (INDEPENDENT_AMBULATORY_CARE_PROVIDER_SITE_OTHER): Payer: Medicare Other

## 2013-07-01 ENCOUNTER — Telehealth: Payer: Self-pay | Admitting: Cardiovascular Disease

## 2013-07-01 DIAGNOSIS — I6350 Cerebral infarction due to unspecified occlusion or stenosis of unspecified cerebral artery: Secondary | ICD-10-CM

## 2013-07-01 DIAGNOSIS — Z79899 Other long term (current) drug therapy: Secondary | ICD-10-CM

## 2013-07-01 DIAGNOSIS — Z01810 Encounter for preprocedural cardiovascular examination: Secondary | ICD-10-CM

## 2013-07-01 DIAGNOSIS — I639 Cerebral infarction, unspecified: Secondary | ICD-10-CM

## 2013-07-01 LAB — BASIC METABOLIC PANEL
BUN: 14 mg/dL (ref 6–23)
CO2: 27 meq/L (ref 19–32)
Calcium: 9.5 mg/dL (ref 8.4–10.5)
Chloride: 106 mEq/L (ref 96–112)
Creatinine, Ser: 0.7 mg/dL (ref 0.4–1.2)
GFR: 93.25 mL/min (ref >60.00)
GLUCOSE: 60 mg/dL — AB (ref 70–99)
POTASSIUM: 4 meq/L (ref 3.5–5.1)
SODIUM: 141 meq/L (ref 135–145)

## 2013-07-01 LAB — CBC WITH DIFFERENTIAL/PLATELET
BASOS PCT: 0.5 % (ref 0.0–3.0)
Basophils Absolute: 0 10*3/uL (ref 0.0–0.1)
EOS PCT: 1.8 % (ref 0.0–5.0)
Eosinophils Absolute: 0.1 10*3/uL (ref 0.0–0.7)
HCT: 36.6 % (ref 36.0–46.0)
Hemoglobin: 11.8 g/dL — ABNORMAL LOW (ref 12.0–15.0)
Lymphocytes Relative: 23.2 % (ref 12.0–46.0)
Lymphs Abs: 1.8 10*3/uL (ref 0.7–4.0)
MCHC: 32.3 g/dL (ref 30.0–36.0)
MCV: 94.6 fl (ref 78.0–100.0)
Monocytes Absolute: 0.6 10*3/uL (ref 0.1–1.0)
Monocytes Relative: 8 % (ref 3.0–12.0)
NEUTROS PCT: 66.5 % (ref 43.0–77.0)
Neutro Abs: 5.2 10*3/uL (ref 1.4–7.7)
PLATELETS: 292 10*3/uL (ref 150.0–400.0)
RBC: 3.87 Mil/uL (ref 3.87–5.11)
RDW: 14.5 % (ref 11.5–14.6)
WBC: 7.9 10*3/uL (ref 4.5–10.5)

## 2013-07-01 LAB — PROTIME-INR
INR: 1.2 ratio — ABNORMAL HIGH (ref 0.8–1.0)
Prothrombin Time: 12.3 s (ref 10.2–12.4)

## 2013-07-03 ENCOUNTER — Other Ambulatory Visit: Payer: Self-pay | Admitting: *Deleted

## 2013-07-03 MED ORDER — CLOPIDOGREL BISULFATE 75 MG PO TABS: 75.0000 mg | ORAL_TABLET | Freq: Every day | ORAL | Status: DC

## 2013-07-03 MED ORDER — QUINAPRIL HCL 40 MG PO TABS: 40.0000 mg | ORAL_TABLET | Freq: Every day | ORAL | Status: DC

## 2013-07-03 NOTE — Telephone Encounter (Signed)
[facility]

## 2013-07-04 ENCOUNTER — Ambulatory Visit: Payer: Medicare Other | Admitting: Cardiovascular Disease

## 2013-07-10 ENCOUNTER — Encounter (HOSPITAL_COMMUNITY): Admission: RE | Payer: Self-pay | Source: Ambulatory Visit

## 2013-07-10 ENCOUNTER — Ambulatory Visit (HOSPITAL_COMMUNITY): Admission: RE | Admit: 2013-07-10 | Payer: Medicare Other | Source: Ambulatory Visit | Admitting: Cardiovascular Disease

## 2013-07-10 SURGERY — LEFT AND RIGHT HEART CATHETERIZATION WITH CORONARY ANGIOGRAM
Anesthesia: LOCAL

## 2013-08-29 ENCOUNTER — Other Ambulatory Visit: Payer: Self-pay | Admitting: *Deleted

## 2013-08-29 NOTE — Telephone Encounter (Signed)
Patient calling for written and signed [place] [facility] Will forward to Dr Fabio BeringNishan's nurse.

## 2013-08-30 MED ORDER — LABETALOL HCL 200 MG PO TABS: 400.0000 mg | ORAL_TABLET | Freq: Two times a day (BID) | ORAL | Status: DC

## 2013-09-05 ENCOUNTER — Other Ambulatory Visit: Payer: Self-pay | Admitting: *Deleted

## 2013-09-05 ENCOUNTER — Telehealth: Payer: Self-pay

## 2013-09-05 MED ORDER — LABETALOL HCL 200 MG PO TABS: 400.0000 mg | ORAL_TABLET | Freq: Two times a day (BID) | ORAL | Status: DC

## 2013-09-05 NOTE — Telephone Encounter (Signed)
Called patient to let her know that I mailed her rx to her

## 2013-09-09 ENCOUNTER — Other Ambulatory Visit: Payer: Self-pay

## 2013-10-30 ENCOUNTER — Telehealth: Payer: Self-pay

## 2013-10-30 MED ORDER — GLIPIZIDE 10 MG PO TABS: 5.0000 mg | ORAL_TABLET | Freq: Every day | ORAL | Status: DC

## 2013-10-30 NOTE — Telephone Encounter (Signed)
3 MONTH SUPPLY  GIVEN  PT  AWARE  OF THE  NEED TO GET PMD .Zack Seal

## 2013-10-30 NOTE — Telephone Encounter (Signed)
Ok to fill it 

## 2013-12-10 ENCOUNTER — Ambulatory Visit: Payer: Medicare Other | Admitting: Physician Assistant

## 2013-12-16 ENCOUNTER — Other Ambulatory Visit: Payer: Self-pay | Admitting: *Deleted

## 2013-12-16 MED ORDER — LABETALOL HCL 200 MG PO TABS: 400.0000 mg | ORAL_TABLET | Freq: Two times a day (BID) | ORAL | Status: DC

## 2013-12-16 MED ORDER — CLOPIDOGREL BISULFATE 75 MG PO TABS: 75.0000 mg | ORAL_TABLET | Freq: Every day | ORAL | Status: DC

## 2013-12-25 ENCOUNTER — Ambulatory Visit: Payer: Medicare Other | Admitting: Cardiovascular Disease

## 2013-12-26 ENCOUNTER — Telehealth: Payer: Self-pay | Admitting: *Deleted

## 2013-12-26 ENCOUNTER — Ambulatory Visit (INDEPENDENT_AMBULATORY_CARE_PROVIDER_SITE_OTHER): Payer: Medicare Other | Admitting: Physician Assistant

## 2013-12-26 ENCOUNTER — Encounter: Payer: Self-pay | Admitting: Physician Assistant

## 2013-12-26 ENCOUNTER — Ambulatory Visit
Admission: RE | Admit: 2013-12-26 | Discharge: 2013-12-26 | Disposition: A | Discharge status: Home / Self Care | Payer: Medicare Other | Source: Ambulatory Visit | Attending: Physician Assistant | Admitting: Physician Assistant

## 2013-12-26 VITALS — BP 140/68 | HR 65 | Ht 59.0 in | Wt 154.0 lb

## 2013-12-26 DIAGNOSIS — R0602 Shortness of breath: Secondary | ICD-10-CM

## 2013-12-26 DIAGNOSIS — I2510 Atherosclerotic heart disease of native coronary artery without angina pectoris: Secondary | ICD-10-CM

## 2013-12-26 DIAGNOSIS — E7800 Pure hypercholesterolemia, unspecified: Secondary | ICD-10-CM

## 2013-12-26 DIAGNOSIS — I359 Nonrheumatic aortic valve disorder, unspecified: Secondary | ICD-10-CM

## 2013-12-26 DIAGNOSIS — I10 Essential (primary) hypertension: Secondary | ICD-10-CM

## 2013-12-26 MED ORDER — LABETALOL HCL 200 MG PO TABS: 400.0000 mg | ORAL_TABLET | Freq: Two times a day (BID) | ORAL | Status: DC

## 2013-12-26 MED ORDER — QUINAPRIL HCL 40 MG PO TABS: 40.0000 mg | ORAL_TABLET | Freq: Every day | ORAL | Status: DC

## 2013-12-26 MED ORDER — CLOPIDOGREL BISULFATE 75 MG PO TABS: 75.0000 mg | ORAL_TABLET | Freq: Every day | ORAL | Status: DC

## 2013-12-26 MED ORDER — NITROGLYCERIN 0.4 MG SL SUBL: 0.4000 mg | SUBLINGUAL_TABLET | SUBLINGUAL | Status: DC | PRN

## 2013-12-26 NOTE — Progress Notes (Signed)
Cardiology Office Note    Date:  12/26/2013   ID:  [name] [name] 17-Dec-1931, MRN [id number]  PCP:  Loree Fee, R, PA-C  Cardiologist:  Dr. Charlton Haws     History of Present Illness: [name] [name] is a 80 y.o. female with a hx of CAD, aortic stenosis, HTN, DM, HL, prior CVA.  She had PCI of the LAD by Dr. Nicki Guadalajara in 1996 and stenting to RCA and CFX by Dr. Primitivo Gauze in 1997.  Last seen by Dr. Charlton Haws in 04/2013.  Echo demonstrated mod AS and new WMA.  She was set up for a Myoview due to new WMA on echo.  This was high risk and R/L HC was to be arranged.  This was never performed.  Patient returns for follow up with her son.  Her cath was scheduled on one of the days that is snowed.  Procedure was never rescheduled.  Patient now has been having progressively worse dyspnea with exertion over the past 2 weeks.  She notes NYHA 3 symptoms. No orthopnea, PND, edema.  No syncope. No chest pain.   Studies:  - Echo (12/14):  Mod LVH, mild focal basal septal hypertrophy, EF 50-55%, dist septal AK, Gr 1 DD, mod AS (mean 24 mmHg), MAC, mild MR, mild LAE, trivial eff  - Nuclear (1/15):  Large severe apical scar, no ischemia, EF 45% - High Risk   Recent Labs/Images: 07/01/2013: Creatinine 0.7; Hemoglobin 11.8*; Potassium 4.0   Wt Readings from Last 3 Encounters:  12/26/13 154 lb (69.854 kg)  06/20/13 158 lb (71.668 kg)  05/09/13 158 lb (71.668 kg)     Past Medical History  Diagnosis Date  . Aortic stenosis   . Hypertension   . Arthritis   . Diabetes mellitus   . Hypercholesterolemia   . Coronary artery disease   . CVA (cerebral vascular accident) Aug 2012    Current Outpatient Prescriptions  Medication Sig Dispense Refill  . aspirin 81 MG tablet Take 81 mg by mouth daily.       . clopidogrel (PLAVIX) 75 MG tablet Take 1 tablet (75 mg total) by mouth daily.  90 tablet  3  . fish oil-omega-3 fatty acids 1000 MG capsule Take 1 g by mouth daily.      Marland Kitchen glipiZIDE (GLUCOTROL)  10 MG tablet Take 0.5 tablets (5 mg total) by mouth daily.  45 tablet  1  . labetalol (NORMODYNE) 200 MG tablet Take 2 tablets (400 mg total) by mouth 2 (two) times daily.  360 tablet  3  . Multiple Vitamin (MULTIVITAMIN) tablet Take 1 tablet by mouth daily.      . quinapril (ACCUPRIL) 40 MG tablet Take 1 tablet (40 mg total) by mouth at bedtime.  90 tablet  3   No current facility-administered medications for this visit.     Allergies:   Sulfa antibiotics   Social History:  The patient  reports that she has never smoked. She has never used smokeless tobacco. She reports that she does not drink alcohol or use illicit drugs.   Family History:  The patient's family history includes Diabetes in her son; Heart attack in her brother and father; Hypertension in her brother, father, mother, and son; Other in an other family member; Stroke in her brother.   ROS:  Please see the history of present illness.   She notes a cough with white sputum production mainly in the mornings.   All other systems reviewed and  negative.   PHYSICAL EXAM: VS:  BP 140/68  Pulse 65  Ht 4\' 11"  (1.499 m)  Wt 154 lb (69.854 kg)  BMI 31.09 kg/m2 Well nourished, well developed, in no acute distress HEENT: normal Neck: no JVDat 90 Cardiac:  normal S1, likely preserved S2; RRR; harsh 2-3/6  Systolic murmurRUSB Lungs:  Decreased breath sounds bilaterally, no wheezing, rhonchi or rales Abd: soft, nontender, no hepatomegaly Ext: no edema Skin: warm and dry Neuro:  CNs 2-12 intact, no focal abnormalities noted  EKG:  NSR, HR 65, normal axis, nonspecific ST-T wave changes, no significant change when compared to prior tracings     ASSESSMENT AND PLAN:  Shortness of breath:  Suspect anginal equivalent given markedly abnormal stress test several months ago, prior history of CAD and aortic stenosis. Recommend proceeding with right and left cardiac catheterization. I reviewed this today with Dr. Verne Carrowhristopher McAlhany (DOD).   He agreed.  Risks and benefits of cardiac catheterization have been discussed with the patient.  These include bleeding, infection, kidney damage, stroke, heart attack, death.  The patient understands these risks and is willing to proceed. Patient is unable to schedule the procedure at this time due to transportation issues. She will call back when she is able.  Continue current medical regimen. I will give her a prescription for when necessary nitroglycerin. If she develops chest pain and needs to use this, she should go to the emergency room.  CAD:  Continue aspirin, Plavix, beta blocker. Proceed with cardiac catheterization as noted above.    AORTIC STENOSIS:  Cardiac catheterization pending as noted above.  HYPERTENSION:  Controlled.  HYPERCHOLESTEROLEMIA:  Intolerant of statins in the past. We could consider low-dose pravastatin 3-4 days a week.   Disposition:  Follow up with Dr. Eden EmmsNishan 2 weeks after cardiac catheterization.   Signed, Brynda RimScott Carmyn Hamm, PA-C, MHS 12/26/2013 12:38 PM    [facility] [address], [place], [place]  [id number] Phone: [phone]; Fax: [phone]

## 2013-12-26 NOTE — Patient Instructions (Addendum)
CHEST X-RAY TODAY; AT [facility] ON [address]  ONCE YOU DECIDE WHEN YOU WOULD LIKE TO HAVE YOUR HEART CATH PLEASE CALL [phone]  REFILLS HAVE BEEN SENT IN FOR PLAVIX, LABETALOL, QUINAPRIL AS WELL AS NITROGLYCERIN  YOU HAVE BEEN ADVISED AS TO HOW AND WHEN TO USE NTG

## 2013-12-26 NOTE — Telephone Encounter (Signed)
pt notified about cxr results with verbal understanding 

## 2013-12-30 ENCOUNTER — Ambulatory Visit (HOSPITAL_COMMUNITY): Admit: 2013-12-30 | Payer: Self-pay | Admitting: Cardiovascular Disease

## 2013-12-30 ENCOUNTER — Ambulatory Visit: Payer: Medicare Other | Admitting: Cardiovascular Disease

## 2013-12-30 ENCOUNTER — Encounter (HOSPITAL_COMMUNITY): Payer: Self-pay

## 2013-12-30 ENCOUNTER — Ambulatory Visit: Payer: Medicare Other | Admitting: Physician Assistant

## 2013-12-30 SURGERY — LEFT AND RIGHT HEART CATHETERIZATION WITH CORONARY ANGIOGRAM
Anesthesia: LOCAL | Laterality: Bilateral

## 2014-01-02 ENCOUNTER — Ambulatory Visit: Payer: Medicare Other | Admitting: Physician Assistant

## 2014-01-15 NOTE — Telephone Encounter (Signed)
error 

## 2014-01-20 ENCOUNTER — Observation Stay: Payer: Self-pay | Admitting: Specialist

## 2014-01-20 LAB — BASIC METABOLIC PANEL
Anion Gap: 8 (ref 7–16)
BUN: 13 mg/dL (ref 7–18)
CALCIUM: 9.3 mg/dL (ref 8.5–10.1)
CHLORIDE: 106 mmol/L (ref 98–107)
CO2: 26 mmol/L (ref 21–32)
Creatinine: 0.86 mg/dL (ref 0.60–1.30)
EGFR (African American): >60
EGFR (Non-African Amer.): >60
Glucose: 100 mg/dL — ABNORMAL HIGH (ref 65–99)
OSMOLALITY: 280 (ref 275–301)
Potassium: 3.8 mmol/L (ref 3.5–5.1)
SODIUM: 140 mmol/L (ref 136–145)

## 2014-01-20 LAB — URINALYSIS, COMPLETE
BACTERIA: NONE SEEN
Bilirubin,UR: NEGATIVE
Blood: NEGATIVE
Glucose,UR: NEGATIVE mg/dL (ref 0–75)
Ketone: NEGATIVE
Nitrite: NEGATIVE
PROTEIN: NEGATIVE
Ph: 7 (ref 4.5–8.0)
RBC,UR: 1 /HPF (ref 0–5)
Specific Gravity: 1.057 (ref 1.003–1.030)
WBC UR: 6 /HPF (ref 0–5)

## 2014-01-20 LAB — CBC
HCT: 36.8 % (ref 35.0–47.0)
HGB: 11.6 g/dL — ABNORMAL LOW (ref 12.0–16.0)
MCH: 29.8 pg (ref 26.0–34.0)
MCHC: 31.6 g/dL — AB (ref 32.0–36.0)
MCV: 94 fL (ref 80–100)
PLATELETS: 267 10*3/uL (ref 150–440)
RBC: 3.9 10*6/uL (ref 3.80–5.20)
RDW: 15.3 % — AB (ref 11.5–14.5)
WBC: 7.8 10*3/uL (ref 3.6–11.0)

## 2014-01-20 LAB — TROPONIN I
Troponin-I: 0.03 ng/mL
Troponin-I: 0.03 ng/mL

## 2014-01-20 LAB — PRO B NATRIURETIC PEPTIDE: B-TYPE NATIURETIC PEPTID: 19466 pg/mL — AB (ref 0–450)

## 2014-01-21 DIAGNOSIS — I059 Rheumatic mitral valve disease, unspecified: Secondary | ICD-10-CM

## 2014-01-21 LAB — TROPONIN I: TROPONIN-I: 0.03 ng/mL

## 2014-01-22 LAB — BASIC METABOLIC PANEL
Anion Gap: 7 (ref 7–16)
BUN: 21 mg/dL — ABNORMAL HIGH (ref 7–18)
CHLORIDE: 105 mmol/L (ref 98–107)
Calcium, Total: 9.1 mg/dL (ref 8.5–10.1)
Co2: 28 mmol/L (ref 21–32)
Creatinine: 1.05 mg/dL (ref 0.60–1.30)
EGFR (African American): 58 — ABNORMAL LOW
EGFR (Non-African Amer.): 50 — ABNORMAL LOW
Glucose: 76 mg/dL (ref 65–99)
Osmolality: 281 (ref 275–301)
Potassium: 3.6 mmol/L (ref 3.5–5.1)
Sodium: 140 mmol/L (ref 136–145)

## 2014-01-22 LAB — HEMOGLOBIN A1C: Hemoglobin A1C: 5.9 % (ref 4.2–6.3)

## 2014-01-27 ENCOUNTER — Emergency Department: Payer: Self-pay | Admitting: Student

## 2014-01-27 LAB — CBC
HCT: 38.4 % (ref 35.0–47.0)
HGB: 12.2 g/dL (ref 12.0–16.0)
MCH: 29.9 pg (ref 26.0–34.0)
MCHC: 31.6 g/dL — AB (ref 32.0–36.0)
MCV: 94 fL (ref 80–100)
Platelet: 274 10*3/uL (ref 150–440)
RBC: 4.07 10*6/uL (ref 3.80–5.20)
RDW: 14.9 % — ABNORMAL HIGH (ref 11.5–14.5)
WBC: 8.8 10*3/uL (ref 3.6–11.0)

## 2014-01-27 LAB — URINALYSIS, COMPLETE
BACTERIA: NONE SEEN
Bilirubin,UR: NEGATIVE
Blood: NEGATIVE
Glucose,UR: NEGATIVE mg/dL (ref 0–75)
Hyaline Cast: 19
Ketone: NEGATIVE
LEUKOCYTE ESTERASE: NEGATIVE
Nitrite: NEGATIVE
PROTEIN: NEGATIVE
Ph: 5 (ref 4.5–8.0)
RBC,UR: <1 /HPF (ref 0–5)
Specific Gravity: 1.008 (ref 1.003–1.030)
Squamous Epithelial: NONE SEEN
Transitional Epi: <1
WBC UR: 1 /HPF (ref 0–5)

## 2014-01-27 LAB — COMPREHENSIVE METABOLIC PANEL
Albumin: 3.5 g/dL (ref 3.4–5.0)
Alkaline Phosphatase: 76 U/L
Anion Gap: 8 (ref 7–16)
BUN: 25 mg/dL — ABNORMAL HIGH (ref 7–18)
Bilirubin,Total: 0.9 mg/dL (ref 0.2–1.0)
CO2: 27 mmol/L (ref 21–32)
CREATININE: 1.22 mg/dL (ref 0.60–1.30)
Calcium, Total: 9.2 mg/dL (ref 8.5–10.1)
Chloride: 106 mmol/L (ref 98–107)
EGFR (African American): 48 — ABNORMAL LOW
EGFR (Non-African Amer.): 42 — ABNORMAL LOW
GLUCOSE: 143 mg/dL — AB (ref 65–99)
OSMOLALITY: 288 (ref 275–301)
POTASSIUM: 5 mmol/L (ref 3.5–5.1)
SGOT(AST): 24 U/L (ref 15–37)
SGPT (ALT): 19 U/L
SODIUM: 141 mmol/L (ref 136–145)
TOTAL PROTEIN: 7.8 g/dL (ref 6.4–8.2)

## 2014-01-27 LAB — TROPONIN I
Troponin-I: <0.02 ng/mL
Troponin-I: <0.02 ng/mL

## 2014-01-27 LAB — PROTIME-INR
INR: 1.1
PROTHROMBIN TIME: 14 s (ref 11.5–14.7)

## 2014-01-27 LAB — APTT: Activated PTT: 27.3 secs (ref 23.6–35.9)

## 2014-01-28 ENCOUNTER — Encounter: Payer: Self-pay | Admitting: Cardiovascular Disease

## 2014-01-28 ENCOUNTER — Ambulatory Visit (INDEPENDENT_AMBULATORY_CARE_PROVIDER_SITE_OTHER): Payer: Medicare Other | Admitting: Cardiovascular Disease

## 2014-01-28 ENCOUNTER — Ambulatory Visit: Payer: Medicare Other | Admitting: Nurse Practitioner

## 2014-01-28 VITALS — BP 167/80 | HR 70 | Ht 59.0 in | Wt 151.2 lb

## 2014-01-28 DIAGNOSIS — I10 Essential (primary) hypertension: Secondary | ICD-10-CM

## 2014-01-28 DIAGNOSIS — I2510 Atherosclerotic heart disease of native coronary artery without angina pectoris: Secondary | ICD-10-CM

## 2014-01-28 DIAGNOSIS — I359 Nonrheumatic aortic valve disorder, unspecified: Secondary | ICD-10-CM

## 2014-01-28 DIAGNOSIS — R0602 Shortness of breath: Secondary | ICD-10-CM

## 2014-01-28 MED ORDER — FUROSEMIDE 20 MG PO TABS: 20.0000 mg | ORAL_TABLET | Freq: Every day | ORAL | Status: DC | PRN

## 2014-01-28 NOTE — Assessment & Plan Note (Signed)
Labetalol was decreased recently due to low blood pressure and a heart rate of 55 beats per minute. If blood pressure continues to be elevated, can consider adding amlodipine.

## 2014-01-28 NOTE — Patient Instructions (Signed)
Your physician has recommended you make the following change in your medication:  Take Lasix 20 mg once daily as needed    Your physician recommends that you schedule a follow-up appointment in:  Please have your appointment with Dr. Eden Emms rescheduled to 1 month from now

## 2014-01-28 NOTE — Assessment & Plan Note (Signed)
The patient had a recent presentation with what seems to be diastolic heart failure. She improved with Lasix. However, she developed volume depletion and hypotension. Lasix is currently on hold. I instructed her to use Lasix 20 mg daily as needed for weight gain, leg edema or increased shortness of breath.

## 2014-01-28 NOTE — Assessment & Plan Note (Signed)
The patient had an abnormal nuclear stress test this year but missed the appointment for cardiac catheterization twice. She currently reports improvement in symptoms. I will have her followup with Dr. Eden Emms in one month to rediscuss this issue.

## 2014-01-28 NOTE — Progress Notes (Signed)
Primary cardiologist: Dr. Eden Emms  HPI  This is an 80 year old female who is here for a followup visit after recent emergency room visit at [facility] for dizziness and hypotension. She has  a hx of CAD, moderate aortic stenosis, HTN, DM, HL, prior CVA.  She had PCI of the LAD by Dr. Nicki Guadalajara in 1996 and stenting to RCA and CFX by Dr. Primitivo Gauze in 1997. Last Echo demonstrated mod AS and new WMA.  She was set up for a Myoview due to new WMA on echo.  This was high risk and R/L HC was to be arranged.  This was never performed.  She lives in [place] and does not drive anymore. Thus, she reports difficulty in going [place]. She was scheduled for heart catheterization twice and did not make that appointment. She was hospitalized briefly at [facility] for heart failure and fluid overload. She was discharged home on Lasix 40 mg once daily. She went to the emergency room at [facility] yesterday with dizziness and presyncope. Blood pressure was 110/63 with a heart rate of 55 beats per minutes. Cardiac enzymes were negative. Labs were remarkable for a creatinine of 1.32 with a BUN of 25 which is above baseline. Chest x-ray showed no acute abnormalities. She was asked to hold her Lasix and decrease Labetalol to 200 mg twice daily. She reports no chest discomfort. Shortness of breath is improved according to the patient after she was placed on small dose Lasix.   Studies:  - Echo (12/14):  Mod LVH, mild focal basal septal hypertrophy, EF 50-55%, dist septal AK, Gr 1 DD, mod AS (mean 24 mmHg), MAC, mild MR, mild LAE, trivial eff  - Nuclear (1/15):  Large severe apical scar, no ischemia, EF 45% - High Risk     Allergies  Allergen Reactions  . Sulfa Antibiotics      Current Outpatient Prescriptions on File Prior to Visit  Medication Sig Dispense Refill  . aspirin 81 MG tablet Take 81 mg by mouth daily.       . clopidogrel (PLAVIX) 75 MG tablet Take 1 tablet (75 mg total) by mouth daily.  21 tablet  3  . fish  oil-omega-3 fatty acids 1000 MG capsule Take 1 g by mouth daily.      Marland Kitchen glipiZIDE (GLUCOTROL) 10 MG tablet Take 0.5 tablets (5 mg total) by mouth daily.  45 tablet  1  . labetalol (NORMODYNE) 200 MG tablet Take 2 tablets (400 mg total) by mouth 2 (two) times daily.  42 tablet  3  . Multiple Vitamin (MULTIVITAMIN) tablet Take 1 tablet by mouth daily.      . quinapril (ACCUPRIL) 40 MG tablet Take 1 tablet (40 mg total) by mouth at bedtime.  21 tablet  3   No current facility-administered medications on file prior to visit.     Past Medical History  Diagnosis Date  . Aortic stenosis   . Hypertension   . Arthritis   . Diabetes mellitus   . Hypercholesterolemia   . Coronary artery disease   . CVA (cerebral vascular accident) Aug 2012     Past Surgical History  Procedure Laterality Date  . Stents    . Cataract extraction       Family History  Problem Relation Age of Onset  . Other      non-contributory  . Heart attack Father   . Heart attack Brother   . Stroke Brother   . Hypertension Brother   . Hypertension Father   .  Hypertension Mother   . Hypertension Son   . Diabetes Son      History   Social History  . Marital Status: Married    Spouse Name: N/A    Number of Children: N/A  . Years of Education: N/A   Occupational History  . Not on file.   Social History Main Topics  . Smoking status: Never Smoker   . Smokeless tobacco: Never Used  . Alcohol Use: No  . Drug Use: No  . Sexual Activity: Not on file   Other Topics Concern  . Not on file   Social History Narrative  . No narrative on file      PHYSICAL EXAM   BP 167/80  Pulse 70  Ht  (1.499 m)  Wt 151 lb 4 oz (68.607 kg)  BMI 30.53 kg/m2 Constitutional: She is oriented to person, place, and time. She appears well-developed and well-nourished. No distress.  HENT: No nasal discharge.  Head: Normocephalic and atraumatic.  Eyes: Pupils are equal and round. No discharge.  Neck: Normal  range of motion. Neck supple. No JVD present. No thyromegaly present.  Cardiovascular: Normal rate, regular rhythm, normal heart sounds. Exam reveals no gallop and no friction rub. There is a 3/6 systolic ejection murmur in the aortic area which is mid peaking. Pulmonary/Chest: Effort normal and breath sounds normal. No stridor. No respiratory distress. She has no wheezes. She has no rales. She exhibits no tenderness.  Abdominal: Soft. Bowel sounds are normal. She exhibits no distension. There is no tenderness. There is no rebound and no guarding.  Musculoskeletal: Normal range of motion. She exhibits no edema and no tenderness.  Neurological: She is alert and oriented to person, place, and time. Coordination normal.  Skin: Skin is warm and dry. No rash noted. She is not diaphoretic. No erythema. No pallor.  Psychiatric: She has a normal mood and affect. Her behavior is normal. Judgment and thought content normal.     GNF:AOZHY  Rhythm  -Left atrial enlargement.   Voltage criteria for LVH  (R(I)+S(III) exceeds 2.50 mV)  -Voltage criteria w/o ST/T abnormality may be normal.   -consider old anterior infarct.   -  Diffuse nonspecific T-abnormality.    ASSESSMENT AND PLAN

## 2014-01-30 ENCOUNTER — Ambulatory Visit: Payer: Medicare Other | Admitting: Cardiology

## 2014-02-12 ENCOUNTER — Ambulatory Visit: Payer: Self-pay | Admitting: Family

## 2014-02-27 ENCOUNTER — Encounter: Payer: Self-pay | Admitting: Internal Medicine

## 2014-02-27 ENCOUNTER — Ambulatory Visit (INDEPENDENT_AMBULATORY_CARE_PROVIDER_SITE_OTHER): Payer: Medicare Other | Admitting: Internal Medicine

## 2014-02-27 VITALS — BP 122/72 | HR 59 | Temp 97.5°F | Ht 58.85 in | Wt 115.0 lb

## 2014-02-27 DIAGNOSIS — R7303 Prediabetes: Secondary | ICD-10-CM

## 2014-02-27 DIAGNOSIS — I10 Essential (primary) hypertension: Secondary | ICD-10-CM

## 2014-02-27 DIAGNOSIS — E7800 Pure hypercholesterolemia, unspecified: Secondary | ICD-10-CM

## 2014-02-27 DIAGNOSIS — I2510 Atherosclerotic heart disease of native coronary artery without angina pectoris: Secondary | ICD-10-CM

## 2014-02-27 DIAGNOSIS — Z8739 Personal history of other diseases of the musculoskeletal system and connective tissue: Secondary | ICD-10-CM

## 2014-02-27 DIAGNOSIS — E118 Type 2 diabetes mellitus with unspecified complications: Secondary | ICD-10-CM

## 2014-02-27 DIAGNOSIS — R7309 Other abnormal glucose: Secondary | ICD-10-CM

## 2014-02-27 LAB — HEMOGLOBIN A1C: HEMOGLOBIN A1C: 5.7 % (ref 4.6–6.5)

## 2014-02-27 MED ORDER — GLIPIZIDE 10 MG PO TABS: 5.0000 mg | ORAL_TABLET | Freq: Every day | ORAL | Status: DC

## 2014-02-27 NOTE — Assessment & Plan Note (Signed)
Well controlled on current therapy Followed by Dr. Kirke CorinArida

## 2014-02-27 NOTE — Assessment & Plan Note (Signed)
No current issues on ASA and Plavix

## 2014-02-27 NOTE — Progress Notes (Addendum)
HPI  Pt presents to the clinic today to establish care. She is transferring care from Dr. Elisabeth [place]on in [place]. She has no concerns today.  Prediabetes: Last A1c reviewed, 5.9%. Does not check sugars regularly. Currently on glipizide. Eye exam and immunizations UTD.  HLD: Lipid profile reviewed. Total 268, HDL 35, LDL 179, triglycerides 271. Has history of statin intolerance.  HTN: BP well controlled on accupril and lasix.  CAD: Recent cardiac cath, follows with cardiology. Statin intolerant but on beta blocker, plavix and aspirin.  Arthritis: Occasional tylenol use.  Flu: 2015 Tetanus: 2015 Pneumovax: 2015 Zostovax: never Mammogram: no longer screening Pap Smear: no longer screening Colonoscopy: 2013, no longer screening Bone Density: unsure Eye Doctor: Yearly Dentist: as needed  Past Medical History  Diagnosis Date  . Aortic stenosis   . Hypertension   . Arthritis   . Diabetes mellitus   . Hypercholesterolemia   . Coronary artery disease   . CVA (cerebral vascular accident) Aug 2012    Current Outpatient Prescriptions  Medication Sig Dispense Refill  . aspirin 81 MG tablet Take 81 mg by mouth daily.       . clopidogrel (PLAVIX) 75 MG tablet Take 1 tablet (75 mg total) by mouth daily.  21 tablet  3  . fish oil-omega-3 fatty acids 1000 MG capsule Take 1 g by mouth daily.      . furosemide (LASIX) 20 MG tablet Take 1 tablet (20 mg total) by mouth daily as needed (For weight gain >3 lbs in two days. Shortess of breath. Swelling.).  90 tablet  3  . glipiZIDE (GLUCOTROL) 10 MG tablet Take 0.5 tablets (5 mg total) by mouth daily.  45 tablet  1  . labetalol (NORMODYNE) 200 MG tablet Take 2 tablets (400 mg total) by mouth 2 (two) times daily.  42 tablet  3  . Multiple Vitamin (MULTIVITAMIN) tablet Take 1 tablet by mouth daily.      . quinapril (ACCUPRIL) 40 MG tablet Take 40 mg by mouth 2 (two) times daily.       No current facility-administered medications for this visit.     Allergies  Allergen Reactions  . Sulfa Antibiotics     Family History  Problem Relation Age of Onset  . Heart attack Father   . Hypertension Father   . Heart attack Brother   . Stroke Brother   . Hypertension Brother   . Hypertension Mother   . Hypertension Son   . Diabetes Son   . Cancer Neg Hx     History   Social History  . Marital Status: Married    Spouse Name: N/A    Number of Children: N/A  . Years of Education: N/A   Occupational History  . Not on file.   Social History Main Topics  . Smoking status: Never Smoker   . Smokeless tobacco: Never Used  . Alcohol Use: No  . Drug Use: No  . Sexual Activity: No   Other Topics Concern  . Not on file   Social History Narrative  . No narrative on file    ROS:  Constitutional: Pt reports fatigue. Denies fever, malaise,  headache or abrupt weight changes.  Respiratory: Denies difficulty breathing, shortness of breath, cough or sputum production.   Cardiovascular: Denies chest pain, chest tightness, palpitations or swelling in the hands or feet.  Musculoskeletal: Pt reports joint pain and swelling. Denies decrease in range of motion, difficulty with gait, muscle pain.  Neurological: Denies dizziness, difficulty with  memory, difficulty with speech or problems with balance and coordination.   No other specific complaints in a complete review of systems (except as listed in HPI above).  PE:  BP 122/72  Pulse 59  Temp(Src) 97.5 F (36.4 C) (Oral)  Ht 4' 10.85" (1.495 m)  Wt 115 lb (52.164 kg)  BMI 23.34 kg/m2  SpO2 99% Wt Readings from Last 3 Encounters:  02/27/14 115 lb (52.164 kg)  01/28/14 151 lb 4 oz (68.607 kg)  12/26/13 154 lb (69.854 kg)    General: Appears her stated age, well developed, well nourished in NAD. Cardiovascular: Normal rate and rhythm. S1,S2 noted. Murmur noted. No rubs or gallops noted. No JVD or BLE edema. No carotid bruits noted. Pulmonary/Chest: Normal effort and positive  vesicular breath sounds. No respiratory distress. No wheezes, rales or ronchi noted.  MSK: Herbdens nodes noted bilaterally. No signs of joint swelling or effusions. No difficulty with gait.  Neurological: Alert and oriented.    BMET    Component Value Date/Time   NA 141 07/01/2013 1119   K 4.0 07/01/2013 1119   CL 106 07/01/2013 1119   CO2 27 07/01/2013 1119   GLUCOSE 60* 07/01/2013 1119   BUN 14 07/01/2013 1119   CREATININE 0.7 07/01/2013 1119   CALCIUM 9.5 07/01/2013 1119    Lipid Panel     Component Value Date/Time   CHOL 268* 01/21/2011 1600   TRIG 271* 01/21/2011 1600   HDL 35* 01/21/2011 1600   CHOLHDL 7.7 01/21/2011 1600   VLDL 54* 01/21/2011 1600   LDLCALC 179* 01/21/2011 1600    CBC    Component Value Date/Time   WBC 7.9 07/01/2013 1119   RBC 3.87 07/01/2013 1119   HGB 11.8* 07/01/2013 1119   HCT 36.6 07/01/2013 1119   PLT 292.0 07/01/2013 1119   MCV 94.6 07/01/2013 1119   MCH 29.7 01/21/2011 1125   MCHC 32.3 07/01/2013 1119   RDW 14.5 07/01/2013 1119   LYMPHSABS 1.8 07/01/2013 1119   MONOABS 0.6 07/01/2013 1119   EOSABS 0.1 07/01/2013 1119   BASOSABS 0.0 07/01/2013 1119    Hgb A1C Lab Results  Component Value Date   HGBA1C 5.9 02/03/2011     Assessment and Plan:

## 2014-02-27 NOTE — Assessment & Plan Note (Signed)
Well controlled on prn tylenol

## 2014-02-27 NOTE — Assessment & Plan Note (Addendum)
She denies history of this Per cardiology note, statin intolerant Last lipid profile reviewed- definately has HLD Discussed low fat diet with her- handout given

## 2014-02-27 NOTE — Assessment & Plan Note (Signed)
Will repeat A1C today If less than 7, will stop glizide

## 2014-02-27 NOTE — Progress Notes (Signed)
Pre visit review using our clinic review tool, if applicable. No additional management support is needed unless otherwise documented below in the visit note. 

## 2014-02-27 NOTE — Patient Instructions (Addendum)
Hemoglobin A1c Test The hemoglobin (HbA1c) test measures the average amount of sugar (glucose) in your blood during the 2-3 months just before the test (rather than measuring the current amount of glucose in the sample of blood). Some of the glucose that circulates in your blood binds to blood proteins. Hemoglobin (Hb) is a blood protein that carries oxygen in the red blood cells (RBCs). When glucose binds to Hb, the glucose-coated Hb is called glycated Hb. Once Hb is glycated, it remains that way for the life of the RBC (about 120 days). The HbA1c test is used to diagnose diabetes mellitus and to monitor long-term control of blood sugar in people who have diabetes mellitus. The HbA1c test can be used in place of or in combination with fasting blood glucose level and oral glucose tolerance tests. There are several methods for measuring the concentration of HbA1c in the blood. One method uses antibodies that bind specifically to HbA1c. A lab instrument then measures the concentration of bound antibodies in a sample. A second method called ion exchange high-pressure liquid chromatography separates HbA1c from other types of Hb on the basis of differences in electrical charge. A third method for measuring HbA1c uses enzymes that react specifically with the glucose on HbA1c to produce a measurable color change. RESULTS  It is your responsibility to obtain your test results. Ask the lab or department performing the test when and how you will get your results. Contact your health care provider to discuss any questions you have about your results.  Range of Normal Values Ranges for normal values may vary among different labs and hospitals. You should always check with your health care provider after having lab work or other tests done to discuss the meaning of your test results and whether your values are considered within normal limits. The range for normal HbA1c test results is from 4.5% to less than 5.7%. Meaning  of Results Outside Normal Value Ranges Abnormally high HbA1c values are most commonly an indication of prediabetes mellitus and diabetes mellitus:  An HbA1c result of 5.7-6.4% is considered diagnostic of prediabetes mellitus.  An HbA1c result of 6.5% or higher on two separate occasions is considered diagnostic of diabetes mellitus. There are certain conditions that can cause a falsely low HbA1c test result. These conditions include pregnancy, blood loss, blood transfusions, anemia caused by premature destruction of red blood cells (hemolytic anemia), and certain unusual forms of Hb (Hb variants), such as sickle cell trait. There are certain conditions that can cause a falsely low HbA1c test result. These conditions include iron deficiency, kidney failure, and certain Hb variants. Document Released: 05/31/2004 Document Revised: 09/23/2013 Document Reviewed: 04/05/2012 Exit[employer] Patient Information 2015 [employer]. This information is not intended to replace advice given to you by your health care provider. Make sure you discuss any questions you have with your health care provider.  

## 2014-02-28 ENCOUNTER — Telehealth: Payer: Self-pay | Admitting: Internal Medicine

## 2014-02-28 NOTE — Telephone Encounter (Signed)
emmi mailed  °

## 2014-02-28 NOTE — Addendum Note (Signed)
Addended by: Roena MaladyEVONTENNO, Toyoko Silos Y on: 02/28/2014 11:31 AM   Modules accepted: Orders

## 2014-03-24 ENCOUNTER — Ambulatory Visit: Payer: Medicare Other | Admitting: Physician Assistant

## 2014-03-25 ENCOUNTER — Ambulatory Visit: Payer: Self-pay | Admitting: Family

## 2014-04-24 ENCOUNTER — Encounter: Payer: Self-pay | Admitting: Cardiovascular Disease

## 2014-04-24 ENCOUNTER — Ambulatory Visit (INDEPENDENT_AMBULATORY_CARE_PROVIDER_SITE_OTHER): Payer: Medicare Other | Admitting: Cardiovascular Disease

## 2014-04-24 VITALS — BP 120/60 | HR 70 | Ht <= 58 in | Wt 150.0 lb

## 2014-04-24 DIAGNOSIS — I359 Nonrheumatic aortic valve disorder, unspecified: Secondary | ICD-10-CM

## 2014-04-24 DIAGNOSIS — I10 Essential (primary) hypertension: Secondary | ICD-10-CM

## 2014-04-24 DIAGNOSIS — I2510 Atherosclerotic heart disease of native coronary artery without angina pectoris: Secondary | ICD-10-CM

## 2014-04-24 NOTE — Assessment & Plan Note (Signed)
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  AS moderate with mild/moderate AR and mild to moderate MR echo [place] 4/15 Continue current meds and lasix 40 mg

## 2014-04-24 NOTE — Assessment & Plan Note (Signed)
Well controlled.  Continue current medications and low sodium Dash type diet.    

## 2014-04-24 NOTE — Assessment & Plan Note (Signed)
Stable with no angina and good activity level.  Continue medical Rx Abnormal myovue  1/15 but patient has not shown up for 2 caths and currently prefers to be Rx  Medically.  No chest pain and myovue with scar not ischemia.

## 2014-04-24 NOTE — Patient Instructions (Signed)
Your physician wants you to follow-up in:  6 MONTHS WITH DR NISHAN  You will receive a reminder letter in the mail two months in advance. If you don't receive a letter, please call our office to schedule the follow-up appointment. Your physician recommends that you continue on your current medications as directed. Please refer to the Current Medication list given to you today. 

## 2014-04-24 NOTE — Progress Notes (Signed)
Patient ID: [name] [name], female   DOB: 04/11/1932, 80 y.o.   MRN: [id number] This is an 80 year old female who is here for a followup visit after recent emergency room visit at [facility] for dizziness and hypotension. She has a hx of CAD, moderate aortic stenosis, HTN, DM, HL, prior CVA. She had PCI of the LAD by Dr. Nicki Guadalajarahomas Kelly in 1996 and stenting to RCA and CFX by Dr. Primitivo GauzeeFranco in 1997. Last Echo demonstrated mod AS and new WMA. She was set up for a Myoview due to new WMA on echo. This was high risk and R/L HC was to be arranged. This was never performed. She [place] in [place] and does not drive anymore. Thus, she reports difficulty in going [place]. She was scheduled for heart catheterization twice and did not make that appointment. She was hospitalized briefly at [facility] for heart failure and fluid overload. She was discharged home on Lasix 40 mg once daily. She went to the emergency room at [facility] yesterday with dizziness and presyncope. Blood pressure was 110/63 with a heart rate of 55 beats per minutes. Cardiac enzymes were negative. Labs were remarkable for a creatinine of 1.32 with a BUN of 25 which is above baseline. Chest x-ray showed no acute abnormalities. She was asked to hold her Lasix and decrease Labetalol to 200 mg twice daily. She reports no chest discomfort. Shortness of breath is improved according to the patient after she was placed on small dose Lasix.   Studies: - Echo (12/14): Mod LVH, mild focal basal septal hypertrophy, EF 50-55%, dist septal AK, Gr 1 DD, mod AS (mean 24 mmHg), MAC, mild MR, mild LAE, trivial eff - Nuclear (1/15): Large severe apical scar, no ischemia, EF 45% - High Risk  Echo 9/15 [place] EF 40-45%  Mild to moderate MR/AR moderate AS mean gradeint  19 mmHg and peak 33 mmHg   Doing well living with son may f/u in [place] has seen CHF nurse there in September   ROS: Denies fever, malais, weight loss, blurry vision, decreased visual acuity, cough, sputum,  SOB, hemoptysis, pleuritic pain, palpitaitons, heartburn, abdominal pain, melena, lower extremity edema, claudication, or rash.  All other systems reviewed and negative  General: Affect appropriate Chronically ill white female  HEENT: normal Neck supple with no adenopathy JVP normal no bruits no thyromegaly Lungs clear with no wheezing and good diaphragmatic motion Heart:  S1/S2 AS/AR murmur, no rub, gallop or click PMI normal Abdomen: benighn, BS positve, no tenderness, no AAA no bruit.  No HSM or HJR Distal pulses intact with no bruits No edema Neuro non-focal Skin warm and dry No muscular weakness   Current Outpatient Prescriptions  Medication Sig Dispense Refill  . aspirin 81 MG tablet Take 81 mg by mouth daily.     . clopidogrel (PLAVIX) 75 MG tablet Take 1 tablet (75 mg total) by mouth daily. 21 tablet 3  . fish oil-omega-3 fatty acids 1000 MG capsule Take 1 g by mouth daily.    . furosemide (LASIX) 20 MG tablet Take 1 tablet (20 mg total) by mouth daily as needed (For weight gain >3 lbs in two days. Shortess of breath. Swelling.). 90 tablet 3  . labetalol (NORMODYNE) 200 MG tablet Take 2 tablets (400 mg total) by mouth 2 (two) times daily. 42 tablet 3  . Multiple Vitamin (MULTIVITAMIN) tablet Take 1 tablet by mouth daily.    . quinapril (ACCUPRIL) 40 MG tablet Take 40 mg by mouth 2 (two) times daily.  No current facility-administered medications for this visit.    Allergies  Sulfa antibiotics  Electrocardiogram: 9/8  SR LVH anterior MI old lateral T wave changes   Assessment and Plan

## 2014-04-28 ENCOUNTER — Telehealth: Payer: Self-pay | Admitting: *Deleted

## 2014-04-28 MED ORDER — LABETALOL HCL 200 MG PO TABS: 400.0000 mg | ORAL_TABLET | Freq: Two times a day (BID) | ORAL | Status: DC

## 2014-04-28 NOTE — Telephone Encounter (Signed)
Patient requests an rx for labetalol be left at the front desk for her to pick up and [facility]. Thanks, MI

## 2014-04-28 NOTE — Telephone Encounter (Signed)
PT  AWARE  SCRIPT LEFT AT FRONT DESK FOR  PICK UP ./CY 

## 2014-04-29 ENCOUNTER — Ambulatory Visit: Payer: Medicare Other | Admitting: Internal Medicine

## 2014-08-22 ENCOUNTER — Telehealth: Payer: Self-pay | Admitting: *Deleted

## 2014-08-22 NOTE — Telephone Encounter (Signed)
Patient requests refill on quinapril. She stated that she takes 40mg  qd and tells me that is what is on her bottle. Looks like it could have been changed by her pcp, but she does not recall this. Please advise. Thanks, MI

## 2014-08-25 MED ORDER — QUINAPRIL HCL 40 MG PO TABS: 40.0000 mg | ORAL_TABLET | Freq: Two times a day (BID) | ORAL | Status: DC

## 2014-08-25 NOTE — Telephone Encounter (Signed)
MED  FILLED  AS QUINAPRIL  40 MG  BID  AS THAT  IS  DIRECTIONS  THAT  ARE IN  LAST OFFICE NOTE  NOTE  SURE  HOW  PHARMACY LABEL  CAN READ  DIFFERENT  AS  1  DAILY

## 2014-08-29 ENCOUNTER — Ambulatory Visit: Payer: Medicare Other | Admitting: Internal Medicine

## 2014-09-01 ENCOUNTER — Encounter: Payer: Self-pay | Admitting: Internal Medicine

## 2014-09-01 ENCOUNTER — Ambulatory Visit (INDEPENDENT_AMBULATORY_CARE_PROVIDER_SITE_OTHER): Payer: Medicare Other | Admitting: Internal Medicine

## 2014-09-01 VITALS — BP 134/68 | HR 60 | Temp 98.0°F | Wt 148.0 lb

## 2014-09-01 DIAGNOSIS — Z8673 Personal history of transient ischemic attack (TIA), and cerebral infarction without residual deficits: Secondary | ICD-10-CM

## 2014-09-01 DIAGNOSIS — Z8739 Personal history of other diseases of the musculoskeletal system and connective tissue: Secondary | ICD-10-CM

## 2014-09-01 DIAGNOSIS — I2510 Atherosclerotic heart disease of native coronary artery without angina pectoris: Secondary | ICD-10-CM | POA: Diagnosis not present

## 2014-09-01 DIAGNOSIS — R7303 Prediabetes: Secondary | ICD-10-CM

## 2014-09-01 DIAGNOSIS — R7309 Other abnormal glucose: Secondary | ICD-10-CM | POA: Diagnosis not present

## 2014-09-01 DIAGNOSIS — I6930 Unspecified sequelae of cerebral infarction: Secondary | ICD-10-CM

## 2014-09-01 DIAGNOSIS — I10 Essential (primary) hypertension: Secondary | ICD-10-CM

## 2014-09-01 DIAGNOSIS — E785 Hyperlipidemia, unspecified: Secondary | ICD-10-CM

## 2014-09-01 LAB — LIPID PANEL
Cholesterol: 237 mg/dL — ABNORMAL HIGH (ref 0–200)
HDL: 38.2 mg/dL — ABNORMAL LOW (ref >39.00)
LDL Cholesterol: 176 mg/dL — ABNORMAL HIGH (ref 0–99)
NonHDL: 198.8
Total CHOL/HDL Ratio: 6
Triglycerides: 115 mg/dL (ref 0.0–149.0)
VLDL: 23 mg/dL (ref 0.0–40.0)

## 2014-09-01 LAB — HEMOGLOBIN A1C: Hgb A1c MFr Bld: 6.2 % (ref 4.6–6.5)

## 2014-09-01 LAB — COMPREHENSIVE METABOLIC PANEL
ALK PHOS: 82 U/L (ref 39–117)
ALT: 13 U/L (ref 0–35)
AST: 15 U/L (ref 0–37)
Albumin: 3.8 g/dL (ref 3.5–5.2)
BILIRUBIN TOTAL: 1.3 mg/dL — AB (ref 0.2–1.2)
BUN: 15 mg/dL (ref 6–23)
CO2: 31 mEq/L (ref 19–32)
CREATININE: 0.78 mg/dL (ref 0.40–1.20)
Calcium: 9.4 mg/dL (ref 8.4–10.5)
Chloride: 104 mEq/L (ref 96–112)
GFR: 75.33 mL/min (ref >60.00)
Glucose, Bld: 132 mg/dL — ABNORMAL HIGH (ref 70–99)
Potassium: 3.7 mEq/L (ref 3.5–5.1)
Sodium: 140 mEq/L (ref 135–145)
Total Protein: 7.6 g/dL (ref 6.0–8.3)

## 2014-09-01 LAB — CBC
HCT: 33.3 % — ABNORMAL LOW (ref 36.0–46.0)
HEMOGLOBIN: 11.1 g/dL — AB (ref 12.0–15.0)
MCHC: 33.4 g/dL (ref 30.0–36.0)
MCV: 91.3 fl (ref 78.0–100.0)
Platelets: 257 10*3/uL (ref 150.0–400.0)
RBC: 3.66 Mil/uL — ABNORMAL LOW (ref 3.87–5.11)
RDW: 15.6 % — ABNORMAL HIGH (ref 11.5–15.5)
WBC: 8.9 10*3/uL (ref 4.0–10.5)

## 2014-09-01 MED ORDER — FUROSEMIDE 20 MG PO TABS: 20.0000 mg | ORAL_TABLET | Freq: Every day | ORAL | Status: AC | PRN

## 2014-09-01 MED ORDER — CLOPIDOGREL BISULFATE 75 MG PO TABS: 75.0000 mg | ORAL_TABLET | Freq: Every day | ORAL | Status: AC

## 2014-09-01 NOTE — Assessment & Plan Note (Signed)
She does not check sugars Will recheck A1C today

## 2014-09-01 NOTE — Assessment & Plan Note (Signed)
Denies angina Will check CMET and Lipid Profile today Continue ASA, Plavix and Metoprolol

## 2014-09-01 NOTE — Progress Notes (Signed)
Pre visit review using our clinic review tool, if applicable. No additional management support is needed unless otherwise documented below in the visit note. 

## 2014-09-01 NOTE — Assessment & Plan Note (Signed)
Continue prn Tylenol.

## 2014-09-01 NOTE — Patient Instructions (Signed)

## 2014-09-01 NOTE — Assessment & Plan Note (Signed)
Well controlled on Accupril and Lasix There was a transcription area 02/2014 in her Accupril, she should be taking once daily, will let cardiology know Will check CBC and CMET today

## 2014-09-01 NOTE — Addendum Note (Signed)
Addended by: Baldomero LamyHAVERS, NATASHA C on: 09/01/2014 04:21 PM   Modules accepted: Kipp BroodSmartSet

## 2014-09-01 NOTE — Assessment & Plan Note (Signed)
Will check CMET and Lipid Profile today She is not on a statin at this time Handout given on low fat diet

## 2014-09-01 NOTE — Progress Notes (Signed)
Subjective:    Patient ID: [name] [name], female    DOB: 1931/12/15, 81 y.o.   MRN: [id number]  HPI  Pt presents to the clinic today for 6 months followup of chronic medical conditions.  Arthritis: Generalized. She takes Tylenol prn for pain.  CAD s/p stroke: She takes Metoprolol, ASA and Plavix daily. She is not on a statin. I can not see where cholesterol has been checked in the last 3 years.  HTN: BP well controlled on Accupril and Lasix. Her BP today is 134/68.  HLD: I can not see where she has had her cholesterol checked in the last 3 years. She does not take any cholesterol medication. She does try to consume a low fat diet.  Prediabetes: Her last A1C was 5.7. She is controlling her sugars with diet. She does not check them at home.  Review of Systems      Past Medical History  Diagnosis Date  . Aortic stenosis   . Hypertension   . Arthritis   . Diabetes mellitus   . Hypercholesterolemia   . Coronary artery disease   . CVA (cerebral vascular accident) Aug 2012    Current Outpatient Prescriptions  Medication Sig Dispense Refill  . aspirin 81 MG tablet Take 81 mg by mouth daily.     . clopidogrel (PLAVIX) 75 MG tablet Take 1 tablet (75 mg total) by mouth daily. 21 tablet 3  . fish oil-omega-3 fatty acids 1000 MG capsule Take 1 g by mouth daily.    . furosemide (LASIX) 20 MG tablet Take 1 tablet (20 mg total) by mouth daily as needed (For weight gain >3 lbs in two days. Shortess of breath. Swelling.). 90 tablet 3  . labetalol (NORMODYNE) 200 MG tablet Take 2 tablets (400 mg total) by mouth 2 (two) times daily. 120 tablet 11  . Multiple Vitamin (MULTIVITAMIN) tablet Take 1 tablet by mouth daily.    . quinapril (ACCUPRIL) 40 MG tablet Take 1 tablet (40 mg total) by mouth 2 (two) times daily. 60 tablet 11   No current facility-administered medications for this visit.    Allergies  Allergen Reactions  . Sulfa Antibiotics     Family History  Problem Relation Age of  Onset  . Heart attack Father   . Hypertension Father   . Heart attack Brother   . Stroke Brother   . Hypertension Brother   . Hypertension Mother   . Hypertension Son   . Diabetes Son   . Cancer Neg Hx     History   Social History  . Marital Status: Married    Spouse Name: N/A  . Number of Children: N/A  . Years of Education: N/A   Occupational History  . Not on file.   Social History Main Topics  . Smoking status: Never Smoker   . Smokeless tobacco: Never Used  . Alcohol Use: No  . Drug Use: No  . Sexual Activity: No   Other Topics Concern  . Not on file   Social History Narrative     Constitutional: Pt reports fatigue. Denies fever, malaise, headache or abrupt weight changes.  Respiratory: Pt reports shortness of breath. Denies difficulty breathing, cough or sputum production.   Cardiovascular: Denies chest pain, chest tightness, palpitations or swelling in the hands or feet.  Neurological: Denies dizziness, difficulty with memory, difficulty with speech or problems with balance and coordination.   No other specific complaints in a complete review of systems (except as listed in  HPI above).  Objective:   Physical Exam  BP 134/68 mmHg  Pulse 60  Temp(Src) 98 F (36.7 C) (Oral)  Wt 148 lb (67.132 kg)  SpO2 98% Wt Readings from Last 3 Encounters:  09/01/14 148 lb (67.132 kg)  04/24/14 150 lb (68.04 kg)  02/27/14 115 lb (52.164 kg)    General: Appears her stated age, chronically ill appearing in NAD. Cardiovascular: Normal rate and rhythm. S1,S2 noted.  Murmur noted. No rubs or gallops noted.  No carotid bruits noted. Pulmonary/Chest: Normal effort and positive vesicular breath sounds. No respiratory distress. No wheezes, rales or ronchi noted.  Musculoskeletal: Using walker for assistance with gait. Neurological: Alert and oriented.   BMET    Component Value Date/Time   NA 141 07/01/2013 1119   K 4.0 07/01/2013 1119   CL 106 07/01/2013 1119   CO2  27 07/01/2013 1119   GLUCOSE 60* 07/01/2013 1119   BUN 14 07/01/2013 1119   CREATININE 0.7 07/01/2013 1119   CALCIUM 9.5 07/01/2013 1119    Lipid Panel     Component Value Date/Time   CHOL 268* 01/21/2011 1600   TRIG 271* 01/21/2011 1600   HDL 35* 01/21/2011 1600   CHOLHDL 7.7 01/21/2011 1600   VLDL 54* 01/21/2011 1600   LDLCALC 179* 01/21/2011 1600    CBC    Component Value Date/Time   WBC 7.9 07/01/2013 1119   RBC 3.87 07/01/2013 1119   HGB 11.8* 07/01/2013 1119   HCT 36.6 07/01/2013 1119   PLT 292.0 07/01/2013 1119   MCV 94.6 07/01/2013 1119   MCH 29.7 01/21/2011 1125   MCHC 32.3 07/01/2013 1119   RDW 14.5 07/01/2013 1119   LYMPHSABS 1.8 07/01/2013 1119   MONOABS 0.6 07/01/2013 1119   EOSABS 0.1 07/01/2013 1119   BASOSABS 0.0 07/01/2013 1119    Hgb A1C Lab Results  Component Value Date   HGBA1C 5.7 02/27/2014        Assessment & Plan:

## 2014-09-01 NOTE — Assessment & Plan Note (Signed)
Will check CMET and Lipid Profile today Continue ASA, Plavix and Metoprolol

## 2014-09-02 MED ORDER — SIMVASTATIN 10 MG PO TABS: 10.0000 mg | ORAL_TABLET | Freq: Every day | ORAL | Status: DC

## 2014-09-02 NOTE — Addendum Note (Signed)
Addended by: Roena MaladyEVONTENNO, Konnie Noffsinger Y on: 09/02/2014 04:49 PM   Modules accepted: Orders

## 2014-09-13 NOTE — H&P (Signed)
PATIENT NAME:  [name] [name], Ciearra W MR#:  [id number] DATE OF BIRTH:  Feb 04, 1932  DATE OF ADMISSION:  01/20/2014  REFERRING PHYSICIAN: Bobetta LimeEryka A. Inocencio HomesGayle, MD  PRIMARY CARE PHYSICIAN: None. She does however follow with cardiology in [place] out of [place].   CHIEF COMPLAINT: Shortness of breath.   HISTORY OF PRESENT ILLNESS: An 81 year old Caucasian female with history of type 2 diabetes uncomplicated as well as coronary artery disease status post PCI and stenting presenting with shortness of breath. She describes dyspnea on exertion essentially present for 10 days; however, progressively worsening with associated cough productive of whitish sputum, no further symptomatology. No fevers, chills, chest pain, palpitations. She is down to the point where she is only able to ambulate a few feet with her walker before becoming remarkably short of breath and decided to present to the hospital for further workup and evaluation. On ER arrival on ambulation, noted desaturated into the mid 80s on room air. Otherwise, no further symptomatology.   REVIEW OF SYSTEMS:  CONSTITUTIONAL: Denies fever, fatigue, weakness.  EYES: Denies blurry vision, double vision, eye pain.  EARS, NOSE, THROAT: Denies tinnitus, ear pain, hearing loss.  RESPIRATORY: Positive for cough as described above. Denies any wheezing. Positive for shortness of breath as described above.  CARDIOVASCULAR: Denies chest pain, palpitations, edema, orthopnea, PND.  GASTROINTESTINAL: Denies nausea, vomiting, diarrhea, abdominal pain.  GENITOURINARY: Denies dysuria, hematuria.  ENDOCRINE: Denies nocturia or thyroid problems. HEMATOLOGIC AND LYMPHATICS: Denies easy bruising, bleeding.  SKIN: Denies rash or lesions.  MUSCULOSKELETAL: Denies pain in neck, back, shoulder, knees, hips or arthritic symptoms.  NEUROLOGIC: Denies paralysis, paresthesias.  PSYCHIATRIC: Denies anxiety or depressive symptoms.  Otherwise, full review of systems performed by me is  negative.   PAST MEDICAL HISTORY: Hypertension, coronary artery disease status post PCI with stent placement, type 2 diabetes non-insulin-requiring, uncomplicated, as well as a history of CVA without neurological deficits.   SOCIAL HISTORY: Denies alcohol, tobacco or drug usage. Positive excessive secondhand smoke exposure. Uses a walker for ambulation at baseline.   FAMILY HISTORY: Positive for lung cancer. Otherwise, denies any further cardiovascular or pulmonary disorders.   ALLERGIES: SULFA DRUGS.   HOME MEDICATIONS: Include aspirin 81 mg p.o. daily, Benicar 40 mg p.o. daily, glipizide 5 mg p.o. daily, Plavix 75 mg p.o. daily, labetalol 200 mg 2 tablets p.o. b.i.d., Lasix 40 mg p.o. daily, fish oil 1000 mg p.o. daily, vitamin B12 at 500 mcg p.o. daily.   PHYSICAL EXAMINATION:  VITAL SIGNS: Temperature 98.3, heart rate 74, respirations 18, blood pressure 150/97, saturating 97% on room air, however, she then subsequently ambulated and desaturated into the mid 80s on room air. Weight 68 kg, BMI 30.3.  GENERAL: Well-nourished, well-developed, Caucasian female, currently in no acute distress.  HEAD: Normocephalic, atraumatic.  EYES: Pupils equal, round, reactive to light. Extraocular muscles intact. No scleral icterus.  MOUTH: Moist mucous membranes. Dentition intact. No abscess noted.  EARS, NOSE, THROAT: Clear without exudates. No external lesions.  NECK: Supple. No thyromegaly. No nodules. No appreciable JVD.  PULMONARY: Scant coarse breath sounds at the bases bilaterally, otherwise good air entry bilaterally. No use of accessory muscles. Good respiratory effort.  CHEST: Nontender to palpation.  CARDIOVASCULAR: S1, S2, regular rate and rhythm with a 2 to 3/6 systolic ejection murmur best heard at right upper sternal border without associated edema. Pedal pulses 2+ bilaterally. GASTROINTESTINAL: Soft, nontender, nondistended. No masses. Positive bowel sounds. No hepatosplenomegaly.   MUSCULOSKELETAL: No swelling, clubbing or edema. Range of motion full in  all extremities.  NEUROLOGIC: Cranial nerves II-XII intact. No gross focal neurological deficits. Sensation intact. Reflexes intact. SKIN: No ulceration, lesions, rashes. Skin warm and dry. Turgor intact.  PSYCHIATRIC: Mood and affect within normal limits. The patient is awake and oriented x 3. Insight and judgment intact.   IMAGING: EKG performed revealing normal sinus rhythm with evidence of left ventricular hypertrophy; otherwise, no ST or T wave abnormalities. Chest x-ray performed reveals cardiomegaly; however, no acute cardiopulmonary process. CT angiogram performed negative for PEs, but does mention cardiomegaly as described above but also evidence of bibasilar atelectasis.   LABORATORY DATA: Sodium 140, potassium 3.8, chloride 106, bicarbonate 26, BUN 13, creatinine 0.8, glucose 100, Troponin 0.03. WBC 7.8, hemoglobin 11.6, platelets of 267,000.   ASSESSMENT AND PLAN: An 81 year old female with history of diabetes, uncomplicated, as well as coronary disease status post percutaneous coronary intervention and stent placement presenting with dyspnea on exertion. 1.  Hypoxemia. Unclear etiology, though based on labs and physical examination, possibly related to valvular heart disease as the likely etiology. Provide DuoNeb treatments q. 4 hours, supplement O2 to keep oxygen saturation greater than 92%. Incentive spirometry. We will check transthoracic echocardiogram. Diurese her with Lasix, follow in's and out's, as well as renal function.  2.  Type 2 diabetes, uncomplicated. Hold p.o. agents. Add insulin sliding scale with q. 6 hour Accu-Cheks.  3.  Coronary artery disease. Continue aspirin, Plavix, beta blockade.  4.  Hypertension. Continue with beta blockade. 5.  Venous thromboembolism prophylaxis with heparin subcutaneous.  CODE STATUS: The patient is a full code.   TIME SPENT: Forty-five minutes.     ____________________________ Cletis Athens. Hower, MD dkh:TT D: 01/20/2014 20:53:16 ET T: 01/20/2014 22:04:33 ET JOB#: [id number]  cc: Cletis Athens. Hower, MD, <Dictator> DAVID Synetta Shadow MD ELECTRONICALLY SIGNED 01/21/2014 1:45

## 2014-09-13 NOTE — Discharge Summary (Signed)
PATIENT NAME:  [name] [name], [name] [name] MR#:  [id number] DATE OF BIRTH:  [birth year]  DATE OF ADMISSION:  01/20/2014 DATE OF DISCHARGE:  01/22/2014  For a detailed note, please see her the history and physical done on admission by Dr. Angelica Ranavid Hower.   DIAGNOSES AT DISCHARGE: Congestive heart failure, likely acute on chronic systolic dysfunction. Hypertension. History of coronary artery disease, status post stent placement, diabetes, mild-to-moderate aortic regurgitation with aortic valve stenosis.   DIET: The patient is being discharged on a low-sodium, low-fat, American Diabetic Association diet.   ACTIVITY: As tolerated.   FOLLOWUP: [facility] Eden [facility]an [facility] in the next 1-2 weeks. Also, followup with [facility] in the next 1-2 weeks.   DISCHARGE MEDICATIONS: Labetalol 400 mg b.i.d., Plavix 75 mg daily, glipizide 5 mg daily, Lasix 40 mg daily, Benicar 40 mg daily, aspirin 81 mg daily, vitamin B12 at 500 mcg daily, fish oil 1000 mg daily.   PERTINENT STUDIES DONE DURING THE HOSPITAL COURSE: As follows: A CT scan of the chest done with contrast showed no evidence of pulmonary embolism or aortic dissection. Four chamber cardiac enlargement. A chest x-ray showing cardiomegaly, but no acute abnormality. A 2-dimensional echocardiogram showing ejection fraction of 40% to 45% and LVH, severely dilated left atrium, mild-to-moderate mitral valve regurgitation, aortic valve calcified, mild-to-moderate aortic regurgitation with moderate aortic valve stenosis.   HOSPITAL COURSE: This is an 81 year old female who presented to the hospital with shortness of breath and increasing O2 requirements.  1.  Congestive heart failure. This was likely the cause of the patient's shortness of breath and increasing O2 requirements. The cause of the CHF is likely acute on chronic systolic dysfunction. The patient's echocardiogram showed an EF of 40% to 45%. The patient was diuresed with IV Lasix, is clinically  feeling much better. She was ambulated on room air, did not desaturate below 90%. At this point, the patient is being discharged on her maintenance Lasix, beta blocker and angiotensin receptor blocker as stated. The patient will continue to follow up with her cardiologist, Dr. Eden EmmsNishan, as an outpatient.  2.  Diabetes. The patient's blood sugars remained stable. She will continue with glipizide.  3.  History of coronary artery disease, status post stent placement. The patient had no acute chest pain. Her cardiac markers x 3 were negative. At this point, she will continue her aspirin, Plavix, beta blocker and angiotensin receptor blocker as stated.  4.  Hypertension. The patient remained stable on her olmesartan and labetalol and she will continue that.   CODE STATUS: The patient is a full code.   DISPOSITION: She is being discharged home with home health nursing services with the [facility] follow up appointment.   TIME SPENT ON DISCHARGE: Forty minutes.   ____________________________ Rolly PancakeVivek J. Cherlynn KaiserSainani, MD vjs:TT D: 01/22/2014 15:13:13 ET T: 01/22/2014 23:02:13 ET JOB#: [id number]  cc: Rolly PancakeVivek J. Cherlynn KaiserSainani, MD, <Dictator> Noralyn PickPeter C. Eden EmmsNishan, MD [facility]   Houston SirenVIVEK J SAINANI MD ELECTRONICALLY SIGNED 01/29/2014 15:50

## 2014-11-25 ENCOUNTER — Telehealth: Payer: Self-pay | Admitting: Cardiovascular Disease

## 2014-11-25 NOTE — Telephone Encounter (Signed)
Patient phoned c/o increased shortness of breath, worse with exertion Patient denies any increased sodium/salt intake, swelling, or weight gain Patient wanting to be seen Discussed with Bing NeighborsScott W PA and given patients history will have patient see Flex tomorrow Appointment date and time given to to patient

## 2014-11-25 NOTE — Telephone Encounter (Signed)
Pt c/o medication issue: 1. Name of Medication: Quinapril 2. How are you currently taking this medication (dosage and times per day)? 40 mg 3. Are you having a reaction (difficulty breathing--STAT)?  Yes  4. What is your medication issue? Pt called states that she was given a new script Quinapril HCl/ 40 mg Pt states that she started taking the medication and that she is having SOB, sore throat and dizziness. Pt states that she is really weak and cant hardly do anything. Req a call back to discuss an alternative medication//sr

## 2014-11-26 ENCOUNTER — Ambulatory Visit (INDEPENDENT_AMBULATORY_CARE_PROVIDER_SITE_OTHER): Payer: Medicare Other | Admitting: Physician Assistant

## 2014-11-26 ENCOUNTER — Encounter: Payer: Self-pay | Admitting: Physician Assistant

## 2014-11-26 VITALS — BP 160/74 | HR 66 | Ht 59.0 in | Wt 151.2 lb

## 2014-11-26 DIAGNOSIS — I10 Essential (primary) hypertension: Secondary | ICD-10-CM | POA: Diagnosis not present

## 2014-11-26 DIAGNOSIS — I2510 Atherosclerotic heart disease of native coronary artery without angina pectoris: Secondary | ICD-10-CM | POA: Diagnosis not present

## 2014-11-26 DIAGNOSIS — R0600 Dyspnea, unspecified: Secondary | ICD-10-CM

## 2014-11-26 DIAGNOSIS — I359 Nonrheumatic aortic valve disorder, unspecified: Secondary | ICD-10-CM

## 2014-11-26 DIAGNOSIS — I350 Nonrheumatic aortic (valve) stenosis: Secondary | ICD-10-CM | POA: Diagnosis not present

## 2014-11-26 DIAGNOSIS — R0602 Shortness of breath: Secondary | ICD-10-CM | POA: Diagnosis not present

## 2014-11-26 DIAGNOSIS — Z01812 Encounter for preprocedural laboratory examination: Secondary | ICD-10-CM | POA: Diagnosis not present

## 2014-11-26 LAB — PROTIME-INR
INR: 1.2 ratio — ABNORMAL HIGH (ref 0.8–1.0)
Prothrombin Time: 13.8 s — ABNORMAL HIGH (ref 9.6–13.1)

## 2014-11-26 LAB — BASIC METABOLIC PANEL
BUN: 16 mg/dL (ref 6–23)
CHLORIDE: 105 meq/L (ref 96–112)
CO2: 25 mEq/L (ref 19–32)
CREATININE: 0.69 mg/dL (ref 0.40–1.20)
Calcium: 9.3 mg/dL (ref 8.4–10.5)
GFR: 86.73 mL/min (ref >60.00)
GLUCOSE: 110 mg/dL — AB (ref 70–99)
Potassium: 3.7 mEq/L (ref 3.5–5.1)
Sodium: 141 mEq/L (ref 135–145)

## 2014-11-26 MED ORDER — LABETALOL HCL 200 MG PO TABS
200.0000 mg | ORAL_TABLET | Freq: Two times a day (BID) | ORAL | Status: AC
Start: 2014-11-26 — End: ?

## 2014-11-26 NOTE — Assessment & Plan Note (Signed)
Blood pressure is elevated. Asked her to restart her quinapril half the dose and continue the labetalol 2 mg twice daily.

## 2014-11-26 NOTE — Patient Instructions (Addendum)
Medication Instructions:  Your physician has recommended you make the following change in your medication:  1- Decrease Quinapril 20 mg by mouth daily 2- Decrease labetalol 200 mg by mouth twice daily   Labwork: Your physician recommends that you have labs today BMET, CBC, and INR  Testing/Procedures: Your physician has requested that you have a right and left cardiac catheterization. Cardiac catheterization is used to diagnose and/or treat various heart conditions. Doctors may recommend this procedure for a number of different reasons. The most common reason is to evaluate chest pain. Chest pain can be a symptom of coronary artery disease (CAD), and cardiac catheterization can show whether plaque is narrowing or blocking your heart's arteries. This procedure is also used to evaluate the valves, as well as measure the blood flow and oxygen levels in different parts of your heart. For further information please visit [email or web address]. Please follow instruction sheet, as given.  A chest x-ray takes a picture of the organs and structures inside the chest, including the heart, lungs, and blood vessels. This test can show several things, including, whether the heart is enlarges; whether fluid is building up in the lungs; and whether pacemaker / defibrillator leads are still in place.  Follow-Up: Your physician recommends that you schedule a follow-up appointment as needed after Cardiac Cath.   Any Other Special Instructions Will Be Listed Below (If Applicable).

## 2014-11-26 NOTE — Assessment & Plan Note (Signed)
Nuclear stress test was high risk. Given the new acute shortness of breath will reschedule right and left heart catheterizations.

## 2014-11-26 NOTE — Progress Notes (Signed)
Patient ID: [name] [name], female   DOB: 17-Jun-1931, 81 y.o.   MRN: [id number]    Date:  11/26/2014   ID:  [name] [name], DOB 1932/03/29, MRN [id number]  PCP:  Nicki Reaper, NP  Primary Cardiologist:  Eden Emms   Chief complaint Shortness of breath   History of Present Illness: [name] [name] is a 81 y.o. female with a hx of CAD, moderate aortic stenosis, HTN, DM, HL, prior CVA. She had PCI of the LAD by Dr. Nicki Guadalajara in 1996 and stenting to RCA and CFX by Dr. Primitivo Gauze in 1997. Last Echo demonstrated mod AS and new WMA. She was set up for a Myoview due to new WMA on echo. This was high risk and R/L HC was to be arranged. This was never performed . She lives in [place] and does not drive anymore. Thus, she reports difficulty in going [place]. She was hospitalized briefly at [facility] for heart failure and fluid overload. She was discharged home on Lasix 40 mg once daily.  She presents today for evaluation of shortness of breath. Her son is with her today.  Patient reports worsening shortness of breath in the last week. She associates this with quinapril.  She stopped taking it 2 days ago but is still severely short of breath.  She sleeps with one pillow usually on her stomach. She is no peripheral edema and no PND.  In exam room she was breathing comfortably and then gets up to Make for short period and then starts breathing comfortably again 2 saturations were between 91 and 98% on room air   The patient currently denies nausea, vomiting, fever, chest pain,  dizziness, , cough, congestion, abdominal pain, hematochezia, melena, lower extremity edema, claudication.  Wt Readings from Last 3 Encounters:  11/26/14 151 lb 3.2 oz (68.584 kg)  03/25/14 152 lb (68.947 kg)  09/01/14 148 lb (67.132 kg)     Past Medical History  Diagnosis Date  . Aortic stenosis   . Hypertension   . Arthritis   . Diabetes mellitus   . Hypercholesterolemia   . Coronary artery disease   . CVA (cerebral vascular  accident) Aug 2012  . CHF (congestive heart failure)   . Cataract     Current Outpatient Prescriptions  Medication Sig Dispense Refill  . aspirin 81 MG tablet Take 81 mg by mouth daily.     . clopidogrel (PLAVIX) 75 MG tablet Take 1 tablet (75 mg total) by mouth daily. 90 tablet 3  . fish oil-omega-3 fatty acids 1000 MG capsule Take 1 g by mouth daily.    . furosemide (LASIX) 20 MG tablet Take 1 tablet (20 mg total) by mouth daily as needed (For weight gain >3 lbs in two days. Shortess of breath. Swelling.). 90 tablet 3  . labetalol (NORMODYNE) 200 MG tablet Take 1 tablet (200 mg total) by mouth 2 (two) times daily. 120 tablet 11  . quinapril (ACCUPRIL) 40 MG tablet Take 20 mg by mouth at bedtime.     No current facility-administered medications for this visit.    Allergies:    Allergies  Allergen Reactions  . Sulfa Antibiotics     Social History:  The patient  reports that she has never smoked. She has never used smokeless tobacco. She reports that she does not drink alcohol or use illicit drugs.   Family history:   Family History  Problem Relation Age of Onset  . Heart attack Father   . Hypertension Father   .  Heart attack Brother   . Stroke Brother   . Hypertension Brother   . Hypertension Mother   . Hypertension Son   . Diabetes Son   . Cancer Neg Hx     ROS:  Please see the history of present illness.  All other systems reviewed and negative.   PHYSICAL EXAM: VS:  BP 160/74 mmHg  Pulse 66  Ht  (1.499 m)  Wt 151 lb 3.2 oz (68.584 kg)  BMI 30.52 kg/m2 Obese, well developed, in no acute distress.  She does have paroxysmal episodes of tachypnea while in the exam room.  O2 saturations 91-98% on room air. She is very frail appearing and has a hard time getting up onto the exam table. She uses a rolling walker HEENT: Pupils are equal round react to light accommodation extraocular movements are intact.  Neck: no JVDNo cervical lymphadenopathy. Cardiac: Regular  rate and rhythm with 2/6 systolic murmur Lungs:  clear to auscultation bilaterally, no wheezing, rhonchi or rales Abd: soft, nontender, positive bowel sounds all quadrants, no hepatosplenomegaly Ext: no lower extremity edema.  2+ radial pulses. Skin: warm and dry Neuro:  Grossly normal      ASSESSMENT AND PLAN:  Problem List Items Addressed This Visit    Essential hypertension    Blood pressure is elevated. Asked her to restart her quinapril half the dose and continue the labetalol 2 mg twice daily.        Relevant Medications   labetalol (NORMODYNE) 200 MG tablet   Dyspnea    I do not think she is in acute heart failure. Her weight is relatively stable compared to previous.  Sound clear on exam. No edema. We'll check a chest x-ray to be thorough.      Coronary atherosclerosis    Nuclear stress test was high risk. Given the new acute shortness of breath will reschedule right and left heart catheterizations.      Relevant Medications   labetalol (NORMODYNE) 200 MG tablet   Aortic valve disorder    Arch stenosis by most recent echo. She has missed 2 appointments for left and right heart catheterizations in the last year. Last week she has become tachypneic with worsening shortness of breath.  We will try again and schedule these rest right and left heart catheterizations.      Relevant Medications   labetalol (NORMODYNE) 200 MG tablet    Other Visit Diagnoses    Pre-procedure lab exam    -  Primary    SOB (shortness of breath)        Relevant Orders    Basic Metabolic Panel (BMET)    CBC with Differential    INR/PT    DG Chest 2 View    Moderate aortic stenosis        Relevant Medications    labetalol (NORMODYNE) 200 MG tablet    Other Relevant Orders    Basic Metabolic Panel (BMET)    CBC with Differential    INR/PT    DG Chest 2 View

## 2014-11-26 NOTE — Assessment & Plan Note (Signed)
Arch stenosis by most recent echo. She has missed 2 appointments for left and right heart catheterizations in the last year. Last week she has become tachypneic with worsening shortness of breath.  We will try again and schedule these rest right and left heart catheterizations.

## 2014-11-26 NOTE — Assessment & Plan Note (Signed)
I do not think she is in acute heart failure. Her weight is relatively stable compared to previous.  Sound clear on exam. No edema. We'll check a chest x-ray to be thorough.

## 2014-11-27 ENCOUNTER — Ambulatory Visit
Admission: RE | Admit: 2014-11-27 | Discharge: 2014-11-27 | Disposition: A | Discharge status: Home / Self Care | Payer: Medicare Other | Source: Ambulatory Visit | Attending: Physician Assistant | Admitting: Physician Assistant

## 2014-11-27 DIAGNOSIS — I350 Nonrheumatic aortic (valve) stenosis: Secondary | ICD-10-CM

## 2014-11-27 DIAGNOSIS — R0602 Shortness of breath: Secondary | ICD-10-CM

## 2014-11-27 LAB — CBC WITH DIFFERENTIAL/PLATELET
Basophils Absolute: 0.1 10*3/uL (ref 0.0–0.1)
Basophils Relative: 0.8 % (ref 0.0–3.0)
EOS ABS: 0.2 10*3/uL (ref 0.0–0.7)
Eosinophils Relative: 2 % (ref 0.0–5.0)
HCT: 35.6 % — ABNORMAL LOW (ref 36.0–46.0)
HEMOGLOBIN: 11.7 g/dL — AB (ref 12.0–15.0)
LYMPHS PCT: 20.1 % (ref 12.0–46.0)
Lymphs Abs: 1.9 10*3/uL (ref 0.7–4.0)
MCHC: 32.8 g/dL (ref 30.0–36.0)
MCV: 91 fl (ref 78.0–100.0)
Monocytes Absolute: 0.6 10*3/uL (ref 0.1–1.0)
Monocytes Relative: 6.5 % (ref 3.0–12.0)
NEUTROS PCT: 70.6 % (ref 43.0–77.0)
Neutro Abs: 6.6 10*3/uL (ref 1.4–7.7)
Platelets: 283 10*3/uL (ref 150.0–400.0)
RBC: 3.91 Mil/uL (ref 3.87–5.11)
RDW: 15.8 % — ABNORMAL HIGH (ref 11.5–15.5)
WBC: 9.4 10*3/uL (ref 4.0–10.5)

## 2014-11-28 ENCOUNTER — Other Ambulatory Visit: Payer: Self-pay | Admitting: Cardiovascular Disease

## 2014-12-02 ENCOUNTER — Encounter (HOSPITAL_COMMUNITY): Admission: AD | Disposition: E | Payer: Self-pay | Source: Ambulatory Visit | Attending: Cardiovascular Disease

## 2014-12-02 ENCOUNTER — Ambulatory Visit (HOSPITAL_COMMUNITY): Payer: Medicare Other

## 2014-12-02 ENCOUNTER — Inpatient Hospital Stay (HOSPITAL_COMMUNITY)
Admission: AD | Admit: 2014-12-02 | Discharge: 2014-12-22 | DRG: 286 | Disposition: E | Payer: Medicare Other | Source: Ambulatory Visit | Attending: Cardiovascular Disease | Admitting: Cardiovascular Disease

## 2014-12-02 DIAGNOSIS — I350 Nonrheumatic aortic (valve) stenosis: Principal | ICD-10-CM | POA: Insufficient documentation

## 2014-12-02 DIAGNOSIS — Z881 Allergy status to other antibiotic agents status: Secondary | ICD-10-CM | POA: Diagnosis not present

## 2014-12-02 DIAGNOSIS — Z7982 Long term (current) use of aspirin: Secondary | ICD-10-CM

## 2014-12-02 DIAGNOSIS — Z823 Family history of stroke: Secondary | ICD-10-CM

## 2014-12-02 DIAGNOSIS — Z7902 Long term (current) use of antithrombotics/antiplatelets: Secondary | ICD-10-CM | POA: Diagnosis not present

## 2014-12-02 DIAGNOSIS — N170 Acute kidney failure with tubular necrosis: Secondary | ICD-10-CM | POA: Diagnosis not present

## 2014-12-02 DIAGNOSIS — E119 Type 2 diabetes mellitus without complications: Secondary | ICD-10-CM | POA: Diagnosis present

## 2014-12-02 DIAGNOSIS — Z955 Presence of coronary angioplasty implant and graft: Secondary | ICD-10-CM

## 2014-12-02 DIAGNOSIS — I5043 Acute on chronic combined systolic (congestive) and diastolic (congestive) heart failure: Secondary | ICD-10-CM | POA: Diagnosis present

## 2014-12-02 DIAGNOSIS — I272 Other secondary pulmonary hypertension: Secondary | ICD-10-CM | POA: Diagnosis present

## 2014-12-02 DIAGNOSIS — I2510 Atherosclerotic heart disease of native coronary artery without angina pectoris: Secondary | ICD-10-CM | POA: Diagnosis present

## 2014-12-02 DIAGNOSIS — I10 Essential (primary) hypertension: Secondary | ICD-10-CM | POA: Diagnosis present

## 2014-12-02 DIAGNOSIS — Z66 Do not resuscitate: Secondary | ICD-10-CM | POA: Diagnosis present

## 2014-12-02 DIAGNOSIS — Z8249 Family history of ischemic heart disease and other diseases of the circulatory system: Secondary | ICD-10-CM | POA: Diagnosis not present

## 2014-12-02 DIAGNOSIS — I270 Primary pulmonary hypertension: Secondary | ICD-10-CM | POA: Diagnosis not present

## 2014-12-02 DIAGNOSIS — E780 Pure hypercholesterolemia: Secondary | ICD-10-CM | POA: Diagnosis present

## 2014-12-02 DIAGNOSIS — D72829 Elevated white blood cell count, unspecified: Secondary | ICD-10-CM | POA: Diagnosis present

## 2014-12-02 DIAGNOSIS — Z8673 Personal history of transient ischemic attack (TIA), and cerebral infarction without residual deficits: Secondary | ICD-10-CM | POA: Diagnosis not present

## 2014-12-02 DIAGNOSIS — D649 Anemia, unspecified: Secondary | ICD-10-CM | POA: Diagnosis present

## 2014-12-02 DIAGNOSIS — G9341 Metabolic encephalopathy: Secondary | ICD-10-CM | POA: Diagnosis present

## 2014-12-02 DIAGNOSIS — Z515 Encounter for palliative care: Secondary | ICD-10-CM

## 2014-12-02 DIAGNOSIS — Z79899 Other long term (current) drug therapy: Secondary | ICD-10-CM | POA: Diagnosis not present

## 2014-12-02 DIAGNOSIS — R570 Cardiogenic shock: Secondary | ICD-10-CM | POA: Insufficient documentation

## 2014-12-02 DIAGNOSIS — R001 Bradycardia, unspecified: Secondary | ICD-10-CM | POA: Insufficient documentation

## 2014-12-02 DIAGNOSIS — I25700 Atherosclerosis of coronary artery bypass graft(s), unspecified, with unstable angina pectoris: Secondary | ICD-10-CM | POA: Diagnosis not present

## 2014-12-02 DIAGNOSIS — J9601 Acute respiratory failure with hypoxia: Secondary | ICD-10-CM | POA: Diagnosis present

## 2014-12-02 HISTORY — PX: PERIPHERAL VASCULAR CATHETERIZATION: SHX172C

## 2014-12-02 HISTORY — PX: CARDIAC CATHETERIZATION: SHX172

## 2014-12-02 LAB — CBC WITH DIFFERENTIAL/PLATELET
BASOS ABS: 0 10*3/uL (ref 0.0–0.1)
Basophils Relative: 0 % (ref 0–1)
EOS PCT: 1 % (ref 0–5)
Eosinophils Absolute: 0.1 10*3/uL (ref 0.0–0.7)
HCT: 34.5 % — ABNORMAL LOW (ref 36.0–46.0)
Hemoglobin: 11.1 g/dL — ABNORMAL LOW (ref 12.0–15.0)
LYMPHS ABS: 1.1 10*3/uL (ref 0.7–4.0)
Lymphocytes Relative: 7 % — ABNORMAL LOW (ref 12–46)
MCH: 30.4 pg (ref 26.0–34.0)
MCHC: 32.2 g/dL (ref 30.0–36.0)
MCV: 94.5 fL (ref 78.0–100.0)
MONO ABS: 0.8 10*3/uL (ref 0.1–1.0)
Monocytes Relative: 5 % (ref 3–12)
NEUTROS ABS: 13 10*3/uL — AB (ref 1.7–7.7)
Neutrophils Relative %: 87 % — ABNORMAL HIGH (ref 43–77)
Platelets: 251 10*3/uL (ref 150–400)
RBC: 3.65 MIL/uL — AB (ref 3.87–5.11)
RDW: 16.2 % — AB (ref 11.5–15.5)
WBC: 14.9 10*3/uL — ABNORMAL HIGH (ref 4.0–10.5)

## 2014-12-02 LAB — COMPREHENSIVE METABOLIC PANEL
ALT: 42 U/L (ref 14–54)
ANION GAP: 14 (ref 5–15)
AST: 46 U/L — AB (ref 15–41)
Albumin: 3.2 g/dL — ABNORMAL LOW (ref 3.5–5.0)
Alkaline Phosphatase: 69 U/L (ref 38–126)
BUN: 17 mg/dL (ref 6–20)
CO2: 18 mmol/L — ABNORMAL LOW (ref 22–32)
Calcium: 8.4 mg/dL — ABNORMAL LOW (ref 8.9–10.3)
Chloride: 105 mmol/L (ref 101–111)
Creatinine, Ser: 1.03 mg/dL — ABNORMAL HIGH (ref 0.44–1.00)
GFR calc Af Amer: 57 mL/min — ABNORMAL LOW (ref >60)
GFR, EST NON AFRICAN AMERICAN: 50 mL/min — AB (ref >60)
GLUCOSE: 415 mg/dL — AB (ref 65–99)
Potassium: 3.9 mmol/L (ref 3.5–5.1)
Sodium: 137 mmol/L (ref 135–145)
Total Bilirubin: 2.2 mg/dL — ABNORMAL HIGH (ref 0.3–1.2)
Total Protein: 6.2 g/dL — ABNORMAL LOW (ref 6.5–8.1)

## 2014-12-02 LAB — POCT I-STAT 3, VENOUS BLOOD GAS (G3P V)
BICARBONATE: 24.2 meq/L — AB (ref 20.0–24.0)
O2 SAT: 54 %
PCO2 VEN: 38.2 mmHg — AB (ref 45.0–50.0)
PH VEN: 7.409 — AB (ref 7.250–7.300)
TCO2: 25 mmol/L (ref 0–100)
pO2, Ven: 28 mmHg — CL (ref 30.0–45.0)

## 2014-12-02 LAB — POCT I-STAT 3, ART BLOOD GAS (G3+)
ACID-BASE DEFICIT: 1 mmol/L (ref 0.0–2.0)
ACID-BASE DEFICIT: 5 mmol/L — AB (ref 0.0–2.0)
BICARBONATE: 18.9 meq/L — AB (ref 20.0–24.0)
Bicarbonate: 24.5 mEq/L — ABNORMAL HIGH (ref 20.0–24.0)
O2 SAT: 87 %
O2 Saturation: 94 %
PCO2 ART: 31.6 mmHg — AB (ref 35.0–45.0)
PH ART: 7.387 (ref 7.350–7.450)
TCO2: 26 mmol/L (ref 0–100)
TCO2: 20 mmol/L (ref 0–100)
pCO2 arterial: 40.7 mmHg (ref 35.0–45.0)
pH, Arterial: 7.385 (ref 7.350–7.450)
pO2, Arterial: 72 mmHg — ABNORMAL LOW (ref 80.0–100.0)
pO2, Arterial: 52 mmHg — ABNORMAL LOW (ref 80.0–100.0)

## 2014-12-02 LAB — APTT: aPTT: 71 seconds — ABNORMAL HIGH (ref 24–37)

## 2014-12-02 LAB — MRSA PCR SCREENING: MRSA by PCR: NEGATIVE

## 2014-12-02 LAB — PROTIME-INR
INR: 1.49 (ref 0.00–1.49)
Prothrombin Time: 18.1 seconds — ABNORMAL HIGH (ref 11.6–15.2)

## 2014-12-02 LAB — HEPARIN LEVEL (UNFRACTIONATED): HEPARIN UNFRACTIONATED: 0.31 [IU]/mL (ref 0.30–0.70)

## 2014-12-02 LAB — BRAIN NATRIURETIC PEPTIDE: B NATRIURETIC PEPTIDE 5: 3442.6 pg/mL — AB (ref 0.0–100.0)

## 2014-12-02 SURGERY — RIGHT/LEFT HEART CATH AND CORONARY ANGIOGRAPHY
Anesthesia: LOCAL

## 2014-12-02 MED ORDER — DOPAMINE-DEXTROSE 3.2-5 MG/ML-% IV SOLN
INTRAVENOUS | Status: DC | PRN
  Administered 2014-12-02: 10 ug/kg/min via INTRAVENOUS

## 2014-12-02 MED ORDER — SODIUM CHLORIDE 0.9 % IJ SOLN
3.0000 mL | Freq: Two times a day (BID) | INTRAMUSCULAR | Status: DC
  Administered 2014-12-02: 3 mL via INTRAVENOUS

## 2014-12-02 MED ORDER — HEPARIN (PORCINE) IN NACL 2-0.9 UNIT/ML-% IJ SOLN
INTRAMUSCULAR | Status: AC
  Filled 2014-12-02: qty 1500

## 2014-12-02 MED ORDER — LIDOCAINE HCL (PF) 1 % IJ SOLN
INTRAMUSCULAR | Status: DC | PRN
  Administered 2014-12-02: 12:00:00

## 2014-12-02 MED ORDER — ALPRAZOLAM 0.5 MG PO TABS
0.5000 mg | ORAL_TABLET | Freq: Once | ORAL | Status: AC
  Administered 2014-12-02: 0.5 mg via ORAL
  Filled 2014-12-02: qty 1

## 2014-12-02 MED ORDER — SODIUM CHLORIDE 0.9 % IV SOLN
250.0000 mL | INTRAVENOUS | Status: DC | PRN
  Administered 2014-12-02: 250 mL via INTRAVENOUS

## 2014-12-02 MED ORDER — HEPARIN (PORCINE) IN NACL 100-0.45 UNIT/ML-% IJ SOLN
INTRAMUSCULAR | Status: DC | PRN
  Administered 2014-12-02: 800 [IU]/h via INTRAVENOUS

## 2014-12-02 MED ORDER — ONDANSETRON HCL 4 MG/2ML IJ SOLN
4.0000 mg | Freq: Four times a day (QID) | INTRAMUSCULAR | Status: DC | PRN
  Administered 2014-12-02: 4 mg via INTRAVENOUS
  Filled 2014-12-02: qty 2

## 2014-12-02 MED ORDER — MIDAZOLAM HCL 2 MG/2ML IJ SOLN
INTRAMUSCULAR | Status: DC | PRN
  Administered 2014-12-02: 1 mg via INTRAVENOUS

## 2014-12-02 MED ORDER — ACETAMINOPHEN 325 MG PO TABS: 650.0000 mg | ORAL_TABLET | ORAL | Status: DC | PRN

## 2014-12-02 MED ORDER — HEPARIN (PORCINE) IN NACL 100-0.45 UNIT/ML-% IJ SOLN
950.0000 [IU]/h | INTRAMUSCULAR | Status: DC
  Administered 2014-12-02: 800 [IU]/h via INTRAVENOUS
  Filled 2014-12-02 (×2): qty 250

## 2014-12-02 MED ORDER — ASPIRIN 81 MG PO CHEW: 81.0000 mg | CHEWABLE_TABLET | ORAL | Status: DC

## 2014-12-02 MED ORDER — INSULIN ASPART 100 UNIT/ML ~~LOC~~ SOLN
0.0000 [IU] | SUBCUTANEOUS | Status: DC
Start: 2014-12-02 — End: 2014-12-03
  Administered 2014-12-02: 9 [IU] via SUBCUTANEOUS
  Administered 2014-12-02: 2 [IU] via SUBCUTANEOUS
  Administered 2014-12-02 – 2014-12-03 (×2): 1 [IU] via SUBCUTANEOUS

## 2014-12-02 MED ORDER — FENTANYL CITRATE (PF) 100 MCG/2ML IJ SOLN
INTRAMUSCULAR | Status: DC | PRN
  Administered 2014-12-02: 25 ug via INTRAVENOUS

## 2014-12-02 MED ORDER — DOPAMINE-DEXTROSE 3.2-5 MG/ML-% IV SOLN: 7.0000 ug/kg/min | INTRAVENOUS | Status: DC

## 2014-12-02 MED ORDER — SODIUM CHLORIDE 0.9 % IJ SOLN: 3.0000 mL | INTRAMUSCULAR | Status: DC | PRN

## 2014-12-02 MED ORDER — HEPARIN SODIUM (PORCINE) 1000 UNIT/ML IJ SOLN
INTRAMUSCULAR | Status: DC | PRN
  Administered 2014-12-02: 4000 [IU] via INTRAVENOUS

## 2014-12-02 MED ORDER — NOREPINEPHRINE BITARTRATE 1 MG/ML IV SOLN
2.0000 ug/min | INTRAVENOUS | Status: DC
  Filled 2014-12-02: qty 4

## 2014-12-02 MED ORDER — PROCHLORPERAZINE EDISYLATE 5 MG/ML IJ SOLN
10.0000 mg | Freq: Once | INTRAMUSCULAR | Status: AC
  Administered 2014-12-02: 10 mg via INTRAVENOUS
  Filled 2014-12-02: qty 2

## 2014-12-02 MED ORDER — LIDOCAINE HCL (PF) 1 % IJ SOLN
INTRAMUSCULAR | Status: AC
  Filled 2014-12-02: qty 30

## 2014-12-02 MED ORDER — HEPARIN BOLUS VIA INFUSION
4000.0000 [IU] | Freq: Once | INTRAVENOUS | Status: DC
  Filled 2014-12-02: qty 4000

## 2014-12-02 MED ORDER — SODIUM CHLORIDE 0.9 % IV SOLN: 250.0000 mL | INTRAVENOUS | Status: DC | PRN

## 2014-12-02 MED ORDER — ASPIRIN 81 MG PO CHEW: 81.0000 mg | CHEWABLE_TABLET | Freq: Every day | ORAL | Status: DC

## 2014-12-02 MED ORDER — PANTOPRAZOLE SODIUM 40 MG IV SOLR
40.0000 mg | INTRAVENOUS | Status: DC
  Administered 2014-12-02: 40 mg via INTRAVENOUS
  Filled 2014-12-02 (×2): qty 40

## 2014-12-02 MED ORDER — SODIUM CHLORIDE 0.9 % IJ SOLN: 3.0000 mL | Freq: Two times a day (BID) | INTRAMUSCULAR | Status: DC

## 2014-12-02 MED ORDER — ONDANSETRON HCL 4 MG/2ML IJ SOLN
INTRAMUSCULAR | Status: DC | PRN
Start: 2014-12-02 — End: 2014-12-02
  Administered 2014-12-02: 2 mg via INTRAVENOUS

## 2014-12-02 MED ORDER — CETYLPYRIDINIUM CHLORIDE 0.05 % MT LIQD
7.0000 mL | Freq: Two times a day (BID) | OROMUCOSAL | Status: DC
  Administered 2014-12-02: 7 mL via OROMUCOSAL

## 2014-12-02 MED ORDER — DOPAMINE-DEXTROSE 3.2-5 MG/ML-% IV SOLN
INTRAVENOUS | Status: AC
  Filled 2014-12-02: qty 250

## 2014-12-02 MED ORDER — IOHEXOL 300 MG/ML  SOLN
INTRAMUSCULAR | Status: DC | PRN
  Administered 2014-12-02: 60 mL via INTRA_ARTERIAL

## 2014-12-02 MED ORDER — FENTANYL CITRATE (PF) 100 MCG/2ML IJ SOLN
INTRAMUSCULAR | Status: AC
  Filled 2014-12-02: qty 2

## 2014-12-02 MED ORDER — ATROPINE SULFATE 0.1 MG/ML IJ SOLN
INTRAMUSCULAR | Status: DC | PRN
Start: 2014-12-02 — End: 2014-12-02
  Administered 2014-12-02 (×2): 0.5 mg via INTRAVENOUS

## 2014-12-02 MED ORDER — ONDANSETRON HCL 4 MG/2ML IJ SOLN
INTRAMUSCULAR | Status: AC
  Filled 2014-12-02: qty 2

## 2014-12-02 MED ORDER — SODIUM CHLORIDE 0.9 % IV SOLN: INTRAVENOUS | Status: DC

## 2014-12-02 MED ORDER — MIDAZOLAM HCL 2 MG/2ML IJ SOLN
INTRAMUSCULAR | Status: AC
  Filled 2014-12-02: qty 2

## 2014-12-02 SURGICAL SUPPLY — 24 items
CATH BALLN WEDGE 5F 110CM (CATHETERS) IMPLANT
CATH INFINITI 5 FR 3DRC (CATHETERS) ×2 IMPLANT
CATH INFINITI 5FR ANG PIGTAIL (CATHETERS) ×2 IMPLANT
CATH INFINITI 5FR MULTPACK ANG (CATHETERS) ×2 IMPLANT
CATH OPTITORQUE TIG 4.0 5F (CATHETERS) ×2 IMPLANT
CATH S G BIP PACING (SET/KITS/TRAYS/PACK) ×2 IMPLANT
CATH SITESEER 5F NTR (CATHETERS) ×2 IMPLANT
CATH SWAN GANZ 7F STRAIGHT (CATHETERS) ×2 IMPLANT
DEVICE RAD COMP TR BAND LRG (VASCULAR PRODUCTS) ×4 IMPLANT
GLIDESHEATH SLEND A-KIT 6F 22G (SHEATH) ×4 IMPLANT
KIT HEART LEFT (KITS) ×4 IMPLANT
KIT HEART RIGHT NAMIC (KITS) ×4 IMPLANT
PACK CARDIAC CATHETERIZATION (CUSTOM PROCEDURE TRAY) ×4 IMPLANT
SHEATH FAST CATH BRACH 5F 5CM (SHEATH) IMPLANT
SHEATH PINNACLE 5F 10CM (SHEATH) IMPLANT
SHEATH PINNACLE 6F 10CM (SHEATH) ×2 IMPLANT
SHEATH PINNACLE 7F 10CM (SHEATH) ×2 IMPLANT
SLEEVE REPOSITIONING LENGTH 30 (MISCELLANEOUS) ×2 IMPLANT
SYR MEDRAD MARK V 150ML (SYRINGE) ×4 IMPLANT
TRANSDUCER W/STOPCOCK (MISCELLANEOUS) ×8 IMPLANT
TUBING CIL FLEX 10 FLL-RA (TUBING) ×4 IMPLANT
WIRE EMERALD 3MM-J .025X260CM (WIRE) ×2 IMPLANT
WIRE EMERALD 3MM-J .035X150CM (WIRE) ×2 IMPLANT
WIRE SAFE-T 1.5MM-J .035X260CM (WIRE) ×2 IMPLANT

## 2014-12-02 NOTE — Progress Notes (Signed)
Patient experiencing nausea and increased restlessness.  Gave a dose of Zofran with no relief.  Repositioned patient multiple times to attempt to increase patient's comfort, patient continues to be restless and repositions herself supine.  Turned on relaxation music via the television, patient requested TV be turned off.  Reduced room lighting.  These nonpharmacologic interventions were unsuccessful.  Patient's restlessness interfering with patient keeping right leg straight and temporary pacemaker has failure to capture.  Per day RN, cardiologist was made aware of patient's restlessness and temporary pacemaker having failure to capture and no new orders were written.  Paged and spoke with cardiologist on call and informed of the above.  New orders received. Ivery QualeJackson, Cicely Ortner A, RN

## 2014-12-02 NOTE — Progress Notes (Signed)
ANTICOAGULATION CONSULT NOTE - Initial Consult  Pharmacy Consult for Heparin  Indication: 3v CAD; s/p PCI   Allergies  Allergen Reactions  . Sulfa Antibiotics     Patient Measurements: Height: 4\' 11"  (149.9 cm) Weight: 151 lb (68.493 kg) IBW/kg (Calculated) : 43.2  Vital Signs: Temp: 96.5 F (35.8 C) (07/12 2000) Temp Source: Axillary (07/12 2000) BP: 94/67 mmHg (07/12 1800) Pulse Rate: 79 (07/12 1900)  Labs:  Recent Labs  12/27/2014 1400 12/27/2014 2150  HGB 11.1*  --   HCT 34.5*  --   PLT 251  --   APTT 71*  --   LABPROT 18.1*  --   INR 1.49  --   HEPARINUNFRC  --  0.31  CREATININE 1.03*  --     Estimated Creatinine Clearance: 36 mL/min (by C-G formula based on Cr of 1.03).   Medical History: Past Medical History  Diagnosis Date  . Aortic stenosis   . Hypertension   . Arthritis   . Diabetes mellitus   . Hypercholesterolemia   . Coronary artery disease   . CVA (cerebral vascular accident) Aug 2012  . CHF (congestive heart failure)   . Cataract     Medications:  Prescriptions prior to admission  Medication Sig Dispense Refill Last Dose  . aspirin 81 MG tablet Take 81 mg by mouth daily.    12/14/2014 at 0715  . clopidogrel (PLAVIX) 75 MG tablet Take 1 tablet (75 mg total) by mouth daily. 90 tablet 3 11/26/2014 at 0715  . fish oil-omega-3 fatty acids 1000 MG capsule Take 1 g by mouth daily.   11/30/2014 at 0715  . furosemide (LASIX) 20 MG tablet Take 1 tablet (20 mg total) by mouth daily as needed (For weight gain >3 lbs in two days. Shortess of breath. Swelling.). 90 tablet 3 11/24/2014 at 0715  . labetalol (NORMODYNE) 200 MG tablet Take 1 tablet (200 mg total) by mouth 2 (two) times daily. 120 tablet 11 11/23/2014 at 0715  . quinapril (ACCUPRIL) 40 MG tablet Take 20 mg by mouth at bedtime.   12/01/2014 at Unknown time    Assessment: [age] YOF who underwent PCI today due SOB. During her cath, it was discovered that she had severe AS with LVEF of 15% and 3v CAD.  Pharmacy was consulted to start heparin infusion in cath lab. Per cardiologist, he was hesitant to stop heparin infusion after cath due to concern for re-occlusion. She is not a surgical candidate and has very poor prognosis. Per CCM, if unable to improve relatively soon, family will support transition to comfort measures. Per RN, femoral site looks clean and no s/s of bleeding observed.   HL is therapeutic at 0.31 on heparin 800 units/hr. No issues with infusion or bleeding noted.  Goal of Therapy:  Heparin level 0.3-0.7 units/ml Monitor platelets by anticoagulation protocol: Yes   Plan:  -Continue heparin 800 units/hr -8h HL -Daily HL/CBC -Monitor daily HL, CBC and s/s of bleeding (esp at femoral site)  Arlean Hoppingorey M. Newman PiesBall, PharmD Clinical Pharmacist Pager [phone]

## 2014-12-02 NOTE — Progress Notes (Signed)
ANTICOAGULATION CONSULT NOTE - Initial Consult  Pharmacy Consult for Heparin  Indication: 3v CAD; s/p PCI   Allergies  Allergen Reactions  . Sulfa Antibiotics     Patient Measurements: Height: 4\' 11"  (149.9 cm) Weight: 151 lb (68.493 kg) IBW/kg (Calculated) : 43.2  Vital Signs: Temp: 97.4 F (36.3 C) (07/12 0823) Temp Source: Oral (07/12 0823) BP: 79/48 mmHg (07/12 1220) Pulse Rate: 0 (07/12 1238)  Labs: No results for input(s): HGB, HCT, PLT, APTT, LABPROT, INR, HEPARINUNFRC, CREATININE, CKTOTAL, CKMB, TROPONINI in the last 72 hours.  Estimated Creatinine Clearance: 46.4 mL/min (by C-G formula based on Cr of 0.69).   Medical History: Past Medical History  Diagnosis Date  . Aortic stenosis   . Hypertension   . Arthritis   . Diabetes mellitus   . Hypercholesterolemia   . Coronary artery disease   . CVA (cerebral vascular accident) Aug 2012  . CHF (congestive heart failure)   . Cataract     Medications:  Prescriptions prior to admission  Medication Sig Dispense Refill Last Dose  . aspirin 81 MG tablet Take 81 mg by mouth daily.    Oct 09, 2014 at 0715  . clopidogrel (PLAVIX) 75 MG tablet Take 1 tablet (75 mg total) by mouth daily. 90 tablet 3 Oct 09, 2014 at 0715  . fish oil-omega-3 fatty acids 1000 MG capsule Take 1 g by mouth daily.   Oct 09, 2014 at 0715  . furosemide (LASIX) 20 MG tablet Take 1 tablet (20 mg total) by mouth daily as needed (For weight gain >3 lbs in two days. Shortess of breath. Swelling.). 90 tablet 3 Oct 09, 2014 at 0715  . labetalol (NORMODYNE) 200 MG tablet Take 1 tablet (200 mg total) by mouth 2 (two) times daily. 120 tablet 11 Oct 09, 2014 at 0715  . quinapril (ACCUPRIL) 40 MG tablet Take 20 mg by mouth at bedtime.   12/01/2014 at Unknown time    Assessment: [age] YOF who underwent PCI today due SOB. During her cath, it was discovered that she had severe AS with LVEF of 15% and 3v CAD. Pharmacy was consulted to start heparin infusion in cath lab. Per  cardiologist, he was hesitant to stop heparin infusion after cath due to concern for re-occlusion. She is not a surgical candidate and has very poor prognosis. Per CCM, if unable to improve relatively soon, family will support transition to comfort measures. Per RN, femoral site looks clean and no s/s of bleeding observed.   Goal of Therapy:  Heparin level 0.3-0.7 units/ml Monitor platelets by anticoagulation protocol: Yes   Plan:  -Heparin 4000 units bolus (given in cath lab) then start infusion at 800 units/hr  -F/u 8 hr HL at 2100 -Monitor daily HL, CBC and s/s of bleeding (esp at femoral site)   Vinnie LevelBenjamin Sebastain Fishbaugh, PharmD., BCPS Clinical Pharmacist Pager [phone]

## 2014-12-02 NOTE — H&P (View-Only) (Signed)
Patient ID: [name] [name], female   DOB: 17-Jun-1931, 81 y.o.   MRN: [id number]    Date:  11/26/2014   ID:  [name] [name], DOB 1932/03/29, MRN [id number]  PCP:  Nicki Reaper, NP  Primary Cardiologist:  Eden Emms   Chief complaint Shortness of breath   History of Present Illness: [name] [name] is a 81 y.o. female with a hx of CAD, moderate aortic stenosis, HTN, DM, HL, prior CVA. She had PCI of the LAD by Dr. Nicki Guadalajara in 1996 and stenting to RCA and CFX by Dr. Primitivo Gauze in 1997. Last Echo demonstrated mod AS and new WMA. She was set up for a Myoview due to new WMA on echo. This was high risk and R/L HC was to be arranged. This was never performed . She lives in [place] and does not drive anymore. Thus, she reports difficulty in going [place]. She was hospitalized briefly at [facility] for heart failure and fluid overload. She was discharged home on Lasix 40 mg once daily.  She presents today for evaluation of shortness of breath. Her son is with her today.  Patient reports worsening shortness of breath in the last week. She associates this with quinapril.  She stopped taking it 2 days ago but is still severely short of breath.  She sleeps with one pillow usually on her stomach. She is no peripheral edema and no PND.  In exam room she was breathing comfortably and then gets up to Make for short period and then starts breathing comfortably again 2 saturations were between 91 and 98% on room air   The patient currently denies nausea, vomiting, fever, chest pain,  dizziness, , cough, congestion, abdominal pain, hematochezia, melena, lower extremity edema, claudication.  Wt Readings from Last 3 Encounters:  11/26/14 151 lb 3.2 oz (68.584 kg)  03/25/14 152 lb (68.947 kg)  09/01/14 148 lb (67.132 kg)     Past Medical History  Diagnosis Date  . Aortic stenosis   . Hypertension   . Arthritis   . Diabetes mellitus   . Hypercholesterolemia   . Coronary artery disease   . CVA (cerebral vascular  accident) Aug 2012  . CHF (congestive heart failure)   . Cataract     Current Outpatient Prescriptions  Medication Sig Dispense Refill  . aspirin 81 MG tablet Take 81 mg by mouth daily.     . clopidogrel (PLAVIX) 75 MG tablet Take 1 tablet (75 mg total) by mouth daily. 90 tablet 3  . fish oil-omega-3 fatty acids 1000 MG capsule Take 1 g by mouth daily.    . furosemide (LASIX) 20 MG tablet Take 1 tablet (20 mg total) by mouth daily as needed (For weight gain >3 lbs in two days. Shortess of breath. Swelling.). 90 tablet 3  . labetalol (NORMODYNE) 200 MG tablet Take 1 tablet (200 mg total) by mouth 2 (two) times daily. 120 tablet 11  . quinapril (ACCUPRIL) 40 MG tablet Take 20 mg by mouth at bedtime.     No current facility-administered medications for this visit.    Allergies:    Allergies  Allergen Reactions  . Sulfa Antibiotics     Social History:  The patient  reports that she has never smoked. She has never used smokeless tobacco. She reports that she does not drink alcohol or use illicit drugs.   Family history:   Family History  Problem Relation Age of Onset  . Heart attack Father   . Hypertension Father   .  Heart attack Brother   . Stroke Brother   . Hypertension Brother   . Hypertension Mother   . Hypertension Son   . Diabetes Son   . Cancer Neg Hx     ROS:  Please see the history of present illness.  All other systems reviewed and negative.   PHYSICAL EXAM: VS:  BP 160/74 mmHg  Pulse 66  Ht  (1.499 m)  Wt 151 lb 3.2 oz (68.584 kg)  BMI 30.52 kg/m2 Obese, well developed, in no acute distress.  She does have paroxysmal episodes of tachypnea while in the exam room.  O2 saturations 91-98% on room air. She is very frail appearing and has a hard time getting up onto the exam table. She uses a rolling walker HEENT: Pupils are equal round react to light accommodation extraocular movements are intact.  Neck: no JVDNo cervical lymphadenopathy. Cardiac: Regular  rate and rhythm with 2/6 systolic murmur Lungs:  clear to auscultation bilaterally, no wheezing, rhonchi or rales Abd: soft, nontender, positive bowel sounds all quadrants, no hepatosplenomegaly Ext: no lower extremity edema.  2+ radial pulses. Skin: warm and dry Neuro:  Grossly normal      ASSESSMENT AND PLAN:  Problem List Items Addressed This Visit    Essential hypertension    Blood pressure is elevated. Asked her to restart her quinapril half the dose and continue the labetalol 2 mg twice daily.        Relevant Medications   labetalol (NORMODYNE) 200 MG tablet   Dyspnea    I do not think she is in acute heart failure. Her weight is relatively stable compared to previous.  Sound clear on exam. No edema. We'll check a chest x-ray to be thorough.      Coronary atherosclerosis    Nuclear stress test was high risk. Given the new acute shortness of breath will reschedule right and left heart catheterizations.      Relevant Medications   labetalol (NORMODYNE) 200 MG tablet   Aortic valve disorder    Arch stenosis by most recent echo. She has missed 2 appointments for left and right heart catheterizations in the last year. Last week she has become tachypneic with worsening shortness of breath.  We will try again and schedule these rest right and left heart catheterizations.      Relevant Medications   labetalol (NORMODYNE) 200 MG tablet    Other Visit Diagnoses    Pre-procedure lab exam    -  Primary    SOB (shortness of breath)        Relevant Orders    Basic Metabolic Panel (BMET)    CBC with Differential    INR/PT    DG Chest 2 View    Moderate aortic stenosis        Relevant Medications    labetalol (NORMODYNE) 200 MG tablet    Other Relevant Orders    Basic Metabolic Panel (BMET)    CBC with Differential    INR/PT    DG Chest 2 View

## 2014-12-02 NOTE — Progress Notes (Signed)
Orthopedic Tech Progress Note Patient Details:  [name] [name] [birth year][name]5 [id number] Delivered knee immobilizer to pt.'s nurse. Ortho Devices Type of Ortho Device: Knee Immobilizer Ortho Device/Splint Interventions: Other (comment)   Lesle ChrisGilliland, Chick Cousins L 12/08/2014, 1:33 PM

## 2014-12-02 NOTE — Consult Note (Signed)
PULMONARY / CRITICAL CARE MEDICINE   Name: [name] [name] MRN: [id number] DOB: 07-15-1931    ADMISSION DATE:  12-25-14 CONSULTATION DATE:  12/25/2014  REFERRING MD :  Dr. Tresa Endo Cardiology  CHIEF COMPLAINT:  Shock  INITIAL PRESENTATION: 81 year old female who decompensated during LHC in cath lab requiring high flow O2, transvenous pacemaker, and vasoactive infusions. She was sent to ICU for admission and PCCM to evaluate.   STUDIES:  RHC & LHC 7/12 >>>  SIGNIFICANT EVENTS: 7/12 > R/LHC with obstructive disease, complicated by shock, bradycardia. Transvenously paced, to ICU.   HISTORY OF PRESENT ILLNESS:  81 year old female with PMH as below, which includes severe AS, HTN, DM, CAD, CVA, CHF. She had PCI of the LAD by Dr. Nicki Guadalajara in 1996 and stenting to RCA and CFX by Dr. Primitivo Gauze in 1997. Last Echo demonstrated mod AS. She has had a significant decline in her functional status over the past few months. They last few weeks have been especially bad according to the son who states that her WOB has been extremely bad. She was unable to speak in full sentences as she got closer to the date of there heart cath 7/12. During the procedure it was discovered that she had severe AS with LVEF 15%, severely elevated pulmonary artery pressures (88/60's) and RV pressures. Case was complicated by patient attempting to defecate, apparently valsalva with development of bradycardia which did not resolve. Transvenous pacer placed in cath lab and started on dopamine. No other interventions in cath lab (3 vessel CAD noted), to ICU, PCCM to see.   PAST MEDICAL HISTORY :   has a past medical history of Aortic stenosis; Hypertension; Arthritis; Diabetes mellitus; Hypercholesterolemia; Coronary artery disease; CVA (cerebral vascular accident) (Aug 2012); CHF (congestive heart failure); and Cataract.  has past surgical history that includes stents; Cataract extraction; and Coronary angioplasty. Prior to Admission  medications   Medication Sig Start Date End Date Taking? Authorizing Provider  aspirin 81 MG tablet Take 81 mg by mouth daily.    Yes Historical Provider, MD  clopidogrel (PLAVIX) 75 MG tablet Take 1 tablet (75 mg total) by mouth daily. 09/01/14  Yes Lorre Munroe, NP  fish oil-omega-3 fatty acids 1000 MG capsule Take 1 g by mouth daily.   Yes Historical Provider, MD  furosemide (LASIX) 20 MG tablet Take 1 tablet (20 mg total) by mouth daily as needed (For weight gain >3 lbs in two days. Shortess of breath. Swelling.). 09/01/14  Yes Lorre Munroe, NP  labetalol (NORMODYNE) 200 MG tablet Take 1 tablet (200 mg total) by mouth 2 (two) times daily. 11/26/14  Yes Dwana Melena, PA-C  quinapril (ACCUPRIL) 40 MG tablet Take 20 mg by mouth at bedtime.   Yes Historical Provider, MD   Allergies  Allergen Reactions  . Sulfa Antibiotics     FAMILY HISTORY:  indicated that her mother is deceased. She indicated that her father is deceased. She indicated that her sister is alive. She indicated that her brother is deceased. She indicated that her daughter is alive. She indicated that her son is alive.  SOCIAL HISTORY:  reports that she has never smoked. She has never used smokeless tobacco. She reports that she does not drink alcohol or use illicit drugs.  REVIEW OF SYSTEMS:  Unable due to encephalopathy  SUBJECTIVE:   VITAL SIGNS: Temp:  [97.4 F (36.3 C)] 97.4 F (36.3 C) (07/12 0823) Pulse Rate:  [0-268] 0 (07/12 1238) Resp:  [  0-43] 20 (07/12 1238) BP: (72-170)/(47-121) 79/48 mmHg (07/12 1220) SpO2:  [0 %-100 %] 0 % (07/12 1238) Weight:  [68.493 kg (151 lb)] 68.493 kg (151 lb) (07/12 0823) HEMODYNAMICS:   VENTILATOR SETTINGS:   INTAKE / OUTPUT: No intake or output data in the 24 hours ending 11/30/2014 1307  PHYSICAL EXAMINATION: General:  Elderly female in mild distress Neuro:  Alert, oriented to self, place. Confusion, agitation HEENT:  [place]/AT, PERRL Cardiovascular:  Paced rhythm Lungs:   Mild crackles. Mild distress Abdomen:  Soft, non-tender, non-distended Musculoskeletal:  No acute deformity or ROM limitation Skin:  Grossly intact  LABS:  CBC  Recent Labs Lab 11/26/14 1557  WBC 9.4  HGB 11.7*  HCT 35.6*  PLT 283.0   Coag's  Recent Labs Lab 11/26/14 1557  INR 1.2*   BMET  Recent Labs Lab 11/26/14 1557  NA 141  K 3.7  CL 105  CO2 25  BUN 16  CREATININE 0.69  GLUCOSE 110*   Electrolytes  Recent Labs Lab 11/26/14 1557  CALCIUM 9.3   Sepsis Markers No results for input(s): LATICACIDVEN, PROCALCITON, O2SATVEN in the last 168 hours. ABG No results for input(s): PHART, PCO2ART, PO2ART in the last 168 hours. Liver Enzymes No results for input(s): AST, ALT, ALKPHOS, BILITOT, ALBUMIN in the last 168 hours. Cardiac Enzymes No results for input(s): TROPONINI, PROBNP in the last 168 hours. Glucose No results for input(s): GLUCAP in the last 168 hours.  Imaging No results found.   ASSESSMENT / PLAN:  PULMONARY A Acute hypoxemic respiratory failure Severe PAH suspect WHO class II in setting heart failure  P:   100% FiO2 with NRB and [place] NO INTUBATION per her and family wishes Pulmonary hygiene Follow ABG Stat CXR  CARDIOVASCULAR A:  Cardiogenic shock Sinus bradycardia, transvenous pacer 7/12 >>> Acute on chronic diastolic/systolic CHF Severe AS 3 vessel CAD, not surgical candidate  P:  Telemetry monitoring DNR, no escalation Dopamine gtt for MAP goal >/ 55. Cap at 10 mcg Cardiology following Not a surgical candidate Check lactic, BNP EKG Heparin gtt per pharmacy  RENAL A:   No acute issues, admission labs pending  P:   Follow labs Correct electrolytes as indicated  GASTROINTESTINAL A:   No acute issues  P:   NPO Protonix for SUP  HEMATOLOGIC A:   Normocytic anemia  P:  Admission CBC pending Transfuse per ICU guidelines Heparin gtt  INFECTIOUS A:   No acute issues  P:   Follow WBC and fever  curve  ENDOCRINE A:   DM   P:   CBG monitoring and SSI TSH  NEUROLOGIC A:   Acute metabolic encephalopathy in setting shock, hypoxia  P:   RASS goal: 0 Monitor   FAMILY  - Updates: Son aware of poor prognosis. Very clear that his mother would never want life support. No intubation or NPPV. Will cap vasopressors at current dose and not escalate. If unable to improve relatively soon, family will support transition to comfort measures.   - Inter-disciplinary family meet or Palliative Care meeting due by:  7/19   Joneen RoachPaul Hoffman, AGACNP-BC [facility] Pager [phone] or [phone]  12/01/2014 1:50 PM   STAFF NOTE: Cindi CarbonI, Daniel Feinstein, MD FACP have personally reviewed patient's available data, including medical history, events of note, physical examination and test results as part of my evaluation. I have discussed with resident/NP and other care providers such as pharmacist, RN and RRT. In addition, I personally evaluated patient and elicited key  findings of: I have had extensive discussions with family son. We discussed patients current circumstances and organ failures. We also discussed patient's prior wishes under circumstances such as this. Family has decided to NOT perform resuscitation if arrest but to continue current medical support for now.  They do not want any additional pressors or escalation of any kind, he also considering stopping all pressors per her prior wishes,  They wish full comfort if failing, they do not want prolonged support on pressors. They wish full comfort if unable to maintain BP or sats. DNI.  On exam she is awake, some mild distress, her PA htn is severe, not to mention EF 1% , triple vessel dz and severe AS, unlikely to survive hospital course or improve OS needs, assess pcxr, lytes, cbc, bnp, ecg, pcxr, may need lasix with her current O 2 needs, MAP goal should lower to 55, poor prognosis  The patient is critically ill with  multiple organ systems failure and requires high complexity decision making for assessment and support, frequent evaluation and titration of therapies, application of advanced monitoring technologies and extensive interpretation of multiple databases.   Critical Care Time devoted to patient care services described in this note is 45  Minutes. This time reflects time of care of this signee: Rory Percy, MD FACP. This critical care time does not reflect procedure time, or teaching time or supervisory time of PA/NP/Med student/Med Resident etc but could involve care discussion time. Rest per NP/medical resident whose note is outlined above and that I agree with   Mcarthur Rossetti. Tyson Alias, MD, FACP Pgr: [phone] [facility] December 04, 2014 2:39 PM

## 2014-12-02 NOTE — Care Management Note (Signed)
Case Management Note  Patient Details  Name: [name] [name] MRN: [id number] Date of Birth: 09/29/1931  Subjective/Objective:    Adm post cath, iv dopamine                Action/Plan: lives w husband, pcp dr Nicki Reaperregina baity   Expected Discharge Date:                  Expected Discharge Plan:     In-House Referral:     Discharge planning Services     Post Acute Care Choice:    Choice offered to:     DME Arranged:    DME Agency:     HH Arranged:    HH Agency:     Status of Service:     Medicare Important Message Given:    Date Medicare IM Given:    Medicare IM give by:    Date Additional Medicare IM Given:    Additional Medicare Important Message give by:     If discussed at Long Length of Stay Meetings, dates discussed:    Additional Comments: ur review done  Hanley HaysDowell, Annalia Metzger T, RN Jun 17, 2014, 2:10 PM

## 2014-12-02 NOTE — Interval H&P Note (Signed)
Cath Lab Visit (complete for each Cath Lab visit)  Clinical Evaluation Leading to the Procedure:   ACS: No.  Non-ACS:    Anginal Classification: CCS III  Anti-ischemic medical therapy: Maximal Therapy (2 or more classes of medications)  Non-Invasive Test Results: High-risk stress test findings: cardiac mortality >3%/year  Prior CABG: No previous CABG      History and Physical Interval Note:  11/27/2014 10:51 AM  [name] [name]  has presented today for surgery, with the diagnosis of moderate aortic stenosis  The various methods of treatment have been discussed with the patient and family. After consideration of risks, benefits and other options for treatment, the patient has consented to  Procedure(s): Right/Left Heart Cath and Coronary Angiography (N/A) as a surgical intervention .  The patient's history has been reviewed, patient examined, no change in status, stable for surgery.  I have reviewed the patient's chart and labs.  Questions were answered to the patient's satisfaction.     Samah Lapiana A

## 2014-12-03 ENCOUNTER — Encounter (HOSPITAL_COMMUNITY): Payer: Self-pay | Admitting: Cardiovascular Disease

## 2014-12-03 LAB — GLUCOSE, CAPILLARY
GLUCOSE-CAPILLARY: 352 mg/dL — AB (ref 65–99)
GLUCOSE-CAPILLARY: 197 mg/dL — AB (ref 65–99)
GLUCOSE-CAPILLARY: 144 mg/dL — AB (ref 65–99)
Glucose-Capillary: 148 mg/dL — ABNORMAL HIGH (ref 65–99)
Glucose-Capillary: 105 mg/dL — ABNORMAL HIGH (ref 65–99)

## 2014-12-03 LAB — BASIC METABOLIC PANEL
ANION GAP: 15 (ref 5–15)
BUN: 26 mg/dL — ABNORMAL HIGH (ref 6–20)
CO2: 19 mmol/L — AB (ref 22–32)
Calcium: 8.9 mg/dL (ref 8.9–10.3)
Chloride: 106 mmol/L (ref 101–111)
Creatinine, Ser: 1.93 mg/dL — ABNORMAL HIGH (ref 0.44–1.00)
GFR calc Af Amer: 27 mL/min — ABNORMAL LOW (ref >60)
GFR, EST NON AFRICAN AMERICAN: 23 mL/min — AB (ref >60)
GLUCOSE: 135 mg/dL — AB (ref 65–99)
Potassium: 4.6 mmol/L (ref 3.5–5.1)
SODIUM: 140 mmol/L (ref 135–145)

## 2014-12-03 LAB — CBC
HCT: 37.2 % (ref 36.0–46.0)
Hemoglobin: 11.9 g/dL — ABNORMAL LOW (ref 12.0–15.0)
MCH: 30.1 pg (ref 26.0–34.0)
MCHC: 32 g/dL (ref 30.0–36.0)
MCV: 94.2 fL (ref 78.0–100.0)
PLATELETS: 329 10*3/uL (ref 150–400)
RBC: 3.95 MIL/uL (ref 3.87–5.11)
RDW: 16.2 % — ABNORMAL HIGH (ref 11.5–15.5)
WBC: 21.1 10*3/uL — ABNORMAL HIGH (ref 4.0–10.5)

## 2014-12-03 LAB — HEPARIN LEVEL (UNFRACTIONATED): Heparin Unfractionated: 0.19 IU/mL — ABNORMAL LOW (ref 0.30–0.70)

## 2014-12-03 MED ORDER — MORPHINE BOLUS VIA INFUSION
5.0000 mg | INTRAVENOUS | Status: DC | PRN
  Filled 2014-12-03: qty 20

## 2014-12-03 MED ORDER — MORPHINE SULFATE 25 MG/ML IV SOLN
10.0000 mg/h | INTRAVENOUS | Status: DC
  Administered 2014-12-03: 10 mg/h via INTRAVENOUS
  Filled 2014-12-03: qty 10

## 2014-12-03 MED ORDER — FUROSEMIDE 10 MG/ML IJ SOLN: 40.0000 mg | Freq: Two times a day (BID) | INTRAMUSCULAR | Status: DC

## 2014-12-08 ENCOUNTER — Telehealth: Payer: Self-pay | Admitting: Cardiovascular Disease

## 2014-12-08 ENCOUNTER — Ambulatory Visit: Admitting: Cardiovascular Disease

## 2014-12-08 NOTE — Telephone Encounter (Signed)
[facility]Ross is not the signing Physician, I called spoke with Penni BombardKendall at [facility] [phone]) made her aware Dr.Kelly will be the signing Physician I also called spoke with [facility]Lynn [facility] she is aware a d/c will be coming over or a new one will be brought to their office for signing.

## 2014-12-09 ENCOUNTER — Telehealth: Payer: Self-pay | Admitting: Cardiovascular Disease

## 2014-12-09 NOTE — Telephone Encounter (Signed)
12/08/2014 Representative from [facility] brought a death certificate by office for Dr. Tresa EndoKelly to sign but Dr. Tresa EndoKelly was out of office so I ask Dr. [place] if he would sign it and he did, I then called [facility] to come pick-up, made copy for our records and put original up front for pick-up. cbr

## 2014-12-18 NOTE — Discharge Summary (Signed)
NAMEMICHAELINA, BLANDINO NO.:  [id number]  MEDICAL RECORD NO.:  [id number]  LOCATION:  2H12C                        FACILITY:  [facility]  PHYSICIAN:  Nelda Bucks, MD DATE OF BIRTH:  01/07/1932  DATE OF ADMISSION:  11/29/2014 DATE OF DISCHARGE:  2014/12/10                              DISCHARGE SUMMARY   DEATH SUMMARY  This is an 81 year old with severe decompensated left heart cath, requiring high-flow oxygen, transvenous pacing and vasoactive infusions. Unfortunately, her left heart cath was semielective as well as her right heart cath showed triple-vessel disease, severe aortic stenosis, and severe pulmonary hypertension.  Post catheterization, she remained to be hypoxic and then shock, and it was crystal clear to her family members that she would not want aggressive heroic care.  We supported her maximally __________.  Throughout the night on pressors, __________ found her to have increased work of breathing, clearly failing in cardiogenic shock and further discussions were held with the son and was decided for full comfort care and the patient had comfort care provided and expired.  FINAL DIAGNOSES UPON DEATH: 1. Severe pulmonary hypertension. 2. Severe aortic stenosis. 3. Severe coronary artery disease and triple-vessel disease. 4. Cardiogenic shock.     Nelda Bucks, MD     DJF/MEDQ  D:  12/17/2014  T:  12/18/2014  Job:  [id number]

## 2014-12-22 NOTE — Progress Notes (Signed)
Patient found with no BP RR or pulse.  Patient pronounced by 2 RN's.MD paged and made aware.

## 2014-12-22 NOTE — Consult Note (Signed)
PULMONARY / CRITICAL CARE MEDICINE   Name: [name] [name] MRN: [id number] DOB: [birth year]    ADMISSION DATE:  17-Dec-2014 CONSULTATION DATE:  2014/12/17  REFERRING MD :  Dr. Tresa Endo Cardiology  CHIEF COMPLAINT:  Shock  INITIAL PRESENTATION: 81 year old female who decompensated during LHC in cath lab requiring high flow O2, transvenous pacemaker, and vasoactive infusions. She was sent to ICU for admission and PCCM to evaluate.   STUDIES:  RHC & LHC 7/12 >>>triple vessel dz, sever AS, severe PA htn  SIGNIFICANT EVENTS: 7/12 > R/LHC with obstructive disease, complicated by shock, bradycardia. Transvenously paced, to ICU.  7/12- DNR established 7/13- pressors off  SUBJECTIVE: remains on high flow O2, mild distress  VITAL SIGNS: Temp:  [96.3 F (35.7 C)-97.5 F (36.4 C)] 96.3 F (35.7 C) (07/13 0400) Pulse Rate:  [0-268] 80 (07/13 0600) Resp:  [0-43] 28 (07/13 0600) BP: (53-170)/(29-121) 88/56 mmHg (07/13 0600) SpO2:  [0 %-100 %] 100 % (07/13 0600) Arterial Line BP: (71-112)/(43-62) 100/51 mmHg (07/13 0600) Weight:  [68.493 kg (151 lb)] 68.493 kg (151 lb) (07/12 0823) HEMODYNAMICS:   VENTILATOR SETTINGS:   INTAKE / OUTPUT:  Intake/Output Summary (Last 24 hours) at 12/18/2014 0735 Last data filed at 12/18/2014 0600  Gross per 24 hour  Intake  512.1 ml  Output    450 ml  Net   62.1 ml    PHYSICAL EXAMINATION: General:  Elderly female in mild distress Neuro:  Alert, oriented to self,  Confusion, agitation remains HEENT:  [employer]/AT, PERRL Cardiovascular:  Paced rhythm s1 s2 RR Lungs:  coarse Abdomen:  Soft, non-tender, non-distended Musculoskeletal:  No acute deformity or ROM limitation Skin:  Grossly intact  LABS:  CBC  Recent Labs Lab 11/26/14 1557 December 17, 2014 1400 11/27/2014 0400  WBC 9.4 14.9* 21.1*  HGB 11.7* 11.1* 11.9*  HCT 35.6* 34.5* 37.2  PLT 283.0 251 329   Coag's  Recent Labs Lab 11/26/14 1557 Dec 17, 2014 1400  APTT  --  71*  INR 1.2* 1.49   BMET  Recent  Labs Lab 11/26/14 1557 2014/12/17 1400 12/09/2014 0400  NA 141 137 140  K 3.7 3.9 4.6  CL 105 105 106  CO2 25 18* 19*  BUN 16 17 26*  CREATININE 0.69 1.03* 1.93*  GLUCOSE 110* 415* 135*   Electrolytes  Recent Labs Lab 11/26/14 1557 2014-12-17 1400 11/30/2014 0400  CALCIUM 9.3 8.4* 8.9   Sepsis Markers No results for input(s): LATICACIDVEN, PROCALCITON, O2SATVEN in the last 168 hours. ABG  Recent Labs Lab 2014-12-17 1128 12-17-14 1309  PHART 7.387 7.385  PCO2ART 40.7 31.6*  PO2ART 72.0* 52.0*   Liver Enzymes  Recent Labs Lab 12-17-14 1400  AST 46*  ALT 42  ALKPHOS 69  BILITOT 2.2*  ALBUMIN 3.2*   Cardiac Enzymes No results for input(s): TROPONINI, PROBNP in the last 168 hours. Glucose  Recent Labs Lab 12-17-2014 1651 12/17/2014 2020 Dec 17, 2014 2326 12/21/2014 0401  GLUCAP 352* 197* 144* 148*    Imaging Dg Chest Port 1 View  12/17/2014   CLINICAL DATA:  Cardiogenic shock.  EXAM: PORTABLE CHEST - 1 VIEW  COMPARISON:  11/27/2014  FINDINGS: The heart is enlarged but stable. There is tortuosity and calcification of the thoracic aorta. A left-sided catheter is noted overlying the left chest. This could be a PleurX drainage catheter but recommend clinical correlation. There is a femoral central venous catheter (possible Swan-Ganz catheter) with its tip likely in the right ventricle.  Low lung volumes with vascular crowding and atelectasis.  Could not exclude asymmetric pulmonary edema in the left lung. No pleural effusions.  IMPRESSION: 1. Cardiac enlargement with vascular congestion. Possible asymmetric edema in the left long. 2. No pleural effusions or pneumothorax. 3. Possible femoral Swan-Ganz catheter with tip in the right ventricle. 4. Possible left-sided pleural drainage catheter.   Electronically Signed   By: Rudie MeyerP.  Gallerani M.D.   On: 12/16/2014 13:50     ASSESSMENT / PLAN:  PULMONARY A Acute hypoxemic respiratory failure Severe PAH suspect WHO class II in setting  heart failure pulm edema P:   Accept sats 88% Goal is reduction in O2 if able in setting severe PA htn Need lasix to improve O2 needs, she is off pressors  CARDIOVASCULAR A:  Cardiogenic shock Sinus bradycardia, transvenous pacer 7/12 >>> Acute on chronic diastolic/systolic CHF Severe AS 3 vessel CAD, not surgical candidate Severe pa htn P:  Would like to dc aline family does NOT want heroics or continued aggressive care Consider dc pacer for comfort care as well NO role restart of any pressors, per family and pt wishes Heparin gtt per pharmacy lasix  RENAL A:   ATN, cardiogenic shock P:   Lasix Chem in am   GASTROINTESTINAL A:   No acute issues  P:   NPO Protonix for SUP Would like to talk to family about allowing her to eat even with asp risk  HEMATOLOGIC A:   Normocytic anemia WBC demarg P:  Heparin gtt per cards  INFECTIOUS A:   leukocytosis  P:   Follow WBC and fever curve  ENDOCRINE A:   DM   P:   CBG monitoring and SSI - consider no further treatments   NEUROLOGIC A:   Acute metabolic encephalopathy in setting shock, hypoxia  P:   Will update family, they are leaning toward full comfort care   FAMILY  - Updates: update son daily, see further discussions   - Inter-disciplinary family meet or Palliative Care meeting due by:  7/19   Ccm time 30 min   Mcarthur Rossettianiel J. Tyson AliasFeinstein, MD, FACP Pgr: [phone] [facility]

## 2014-12-22 NOTE — Progress Notes (Signed)
RT Note: MD ordered withdrawal of life sustaining support protocol. Patient is not intubated but is on a non rebreather and a nasal cannula. RT assessed patient and she appears comfortable on that oxygen therapy presently. RT asked her RN, Samson Fredericlla if there is something we need to do for her and she stated that she is starting Morphine and then will wean her oxygen off and does not need us right now. Rt will continue to monitor and assist however needed.

## 2014-12-22 NOTE — Progress Notes (Signed)
ANTICOAGULATION CONSULT NOTE - Follow-up Consult  Pharmacy Consult for Heparin  Indication: 3v CAD; s/p PCI   Allergies  Allergen Reactions  . Sulfa Antibiotics     Patient Measurements: Height: 4\' 11"  (149.9 cm) Weight: 151 lb (68.493 kg) IBW/kg (Calculated) : 43.2  Heparin dosing wt: 58 kg  Vital Signs: Temp: 96.3 F (35.7 C) (07/13 0400) Temp Source: Axillary (07/13 0400) BP: 88/56 mmHg (07/13 0600) Pulse Rate: 80 (07/13 0600)  Labs:  Recent Labs  12/11/2014 1400 12/11/2014 2150 01-19-2015 0400  HGB 11.1*  --   --   HCT 34.5*  --   --   PLT 251  --   --   APTT 71*  --   --   LABPROT 18.1*  --   --   INR 1.49  --   --   HEPARINUNFRC  --  0.31 0.19*  CREATININE 1.03*  --  1.93*    Estimated Creatinine Clearance: 19.2 mL/min (by C-G formula based on Cr of 1.93).   Assessment: [age] YOF who underwent PCI 7/12. During her cath, it was discovered that she had severe AS with LVEF of 15% and 3v CAD. Heparin restarted post cath due to concern for re-occlusion. She is not a surgical candidate and has very poor prognosis. Per CCM, if unable to improve relatively soon, family will support transition to comfort measures.   Heparin level is subtherapeutic at 0.19 on heparin 800 units/hr. No issues with infusion or bleeding noted per RN.  Goal of Therapy:  Heparin level 0.3-0.7 units/ml Monitor platelets by anticoagulation protocol: Yes   Plan:  -Increase heparin to 950 units/hr -8h HL  Christoper Fabianaron Hagan [place], PharmD, BCPS Clinical pharmacist, pager [phone] 26-Apr-2015 6:34 AM

## 2014-12-22 NOTE — Progress Notes (Signed)
Chaplain responded to end of life consult. Chaplain facilitated storytelling, offered emotional support, and offered prayer. Pt is [employer] and family noted that she would appreciate prayer. Pt has expressed to family that she is "tired." Family seems to be grieving appropriately and supporting each other. Chaplain informed pt family of her services should they need them. Page chaplain as needed. [phone].   09-07-14 0900  Clinical Encounter Type  Visited With Patient and family together  Visit Type Spiritual support;Patient actively dying  Referral From Nurse  Spiritual Encounters  Spiritual Needs Emotional;Grief support;Prayer  Stress Factors  Family Stress Factors Loss  [name] [name], Mayer MaskerCourtney F, Chaplain Oct 04, 2014 9:29 AM

## 2014-12-22 NOTE — Progress Notes (Signed)
Patient ID: Karle StarchBetty W Borbon, female   DOB: 11/27/1931, 81 y.o.   MRN: [id number] Extensive discussion with family . We discussed the poor prognosis and likely poor quality of life. Family has decided to offer full comfort care. They are aware that the patient may be transferred to palliative care floor for continued comfort care needs. They have been fully updated on the process and expectations.  Plan will be low dose morphine now until daughter arrives then dc pacer and dopamine and to [place] O2 And further titration morphine to rr 12-18 and pain  Mcarthur Rossettianiel J. Tyson AliasFeinstein, MD, FACP Pgr: [phone] [facility]

## 2014-12-22 DEATH — deceased

## 2015-02-15 IMAGING — CT CT ANGIO CHEST
2 of 6 series · 18 of 36 positions shown · IV contrast (APPLIED)
Comparison: None available

CLINICAL DATA: Pt to triage with family with reports of SHOB x 1
week. Pt states mild cough, denies chest pain. Family states
breathing is getting worse. No resp distress noted, resp even and
nonlabored, pt speaking in full non interrupted sentecnes.

EXAM:
CT ANGIOGRAPHY CHEST WITH CONTRAST
TECHNIQUE: Multidetector CT imaging of the chest was performed using the
standard protocol during bolus administration of intravenous
contrast. Multiplanar CT image reconstructions and MIPs were
obtained to evaluate the vascular anatomy.
CONTRAST:  100 mL Isovue 370 IV

[Series 5: pe 1.0 thins · axial · 0.61mm/px · z∈[-253,-24]mm · 17 of 259 slices shown]
[im 15/259  lung]
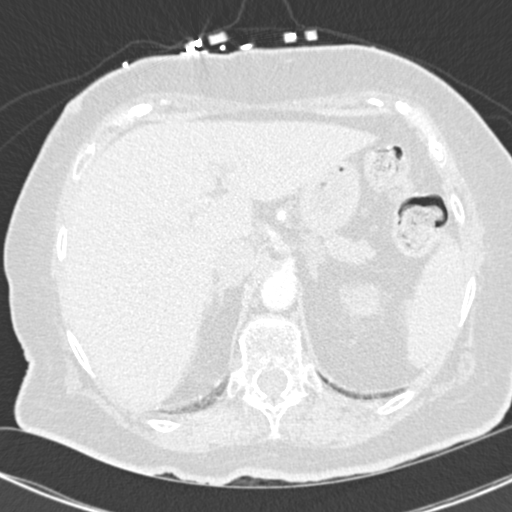
[im 29/259  mediastinal]
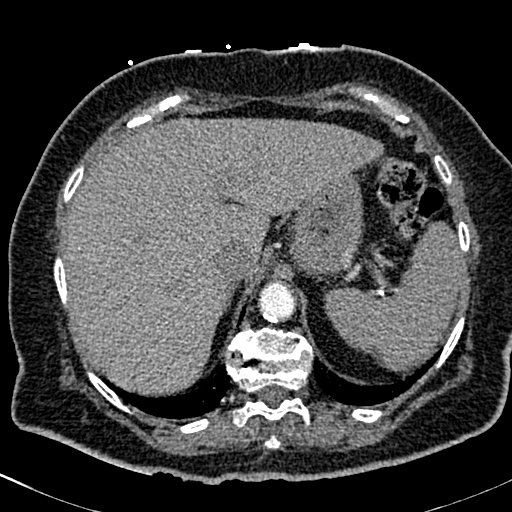
[im 44/259  lung]
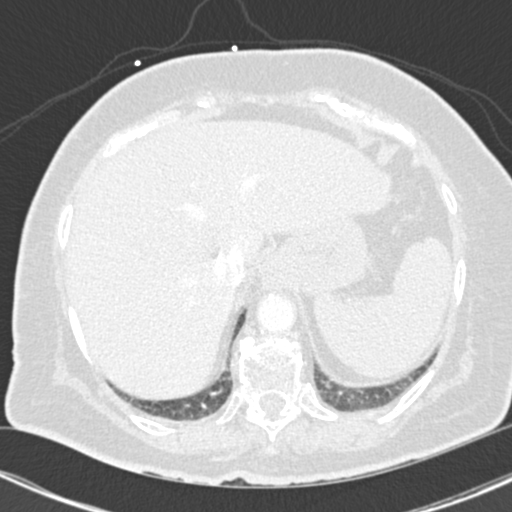
[im 58/259  mediastinal]
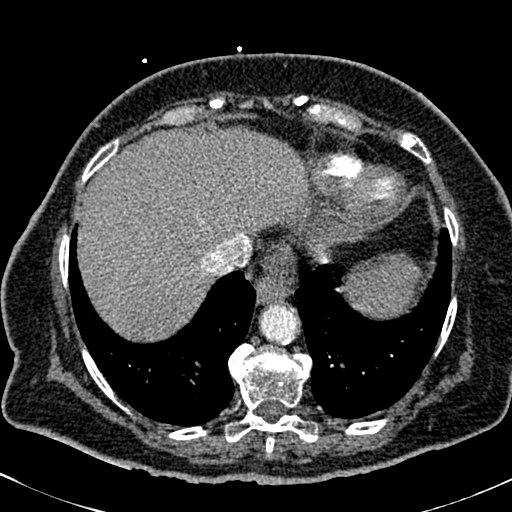
[im 72/259  lung]
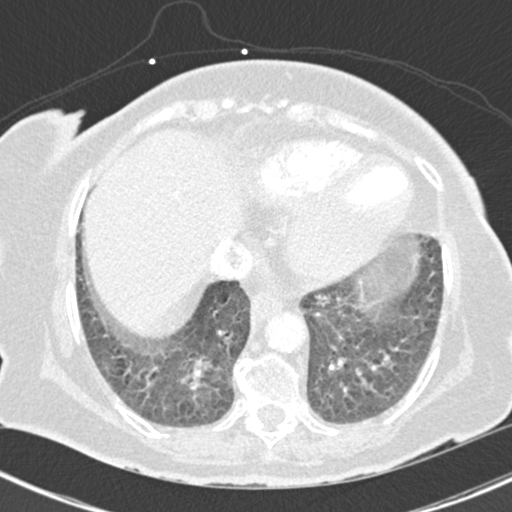
[im 87/259  mediastinal]
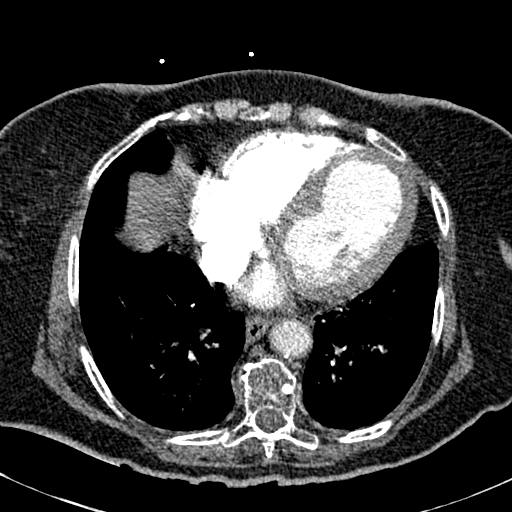
[im 101/259  lung]
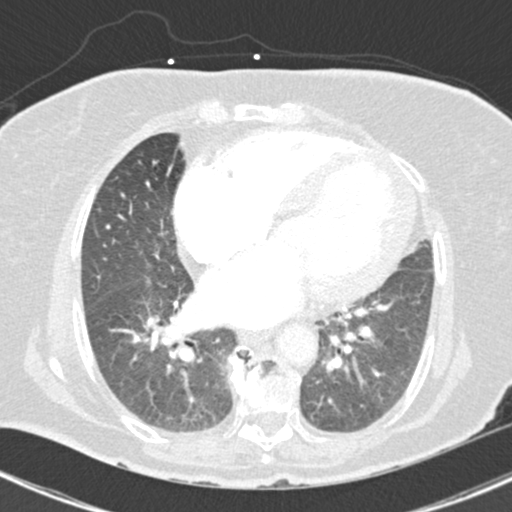
[im 115/259  mediastinal]
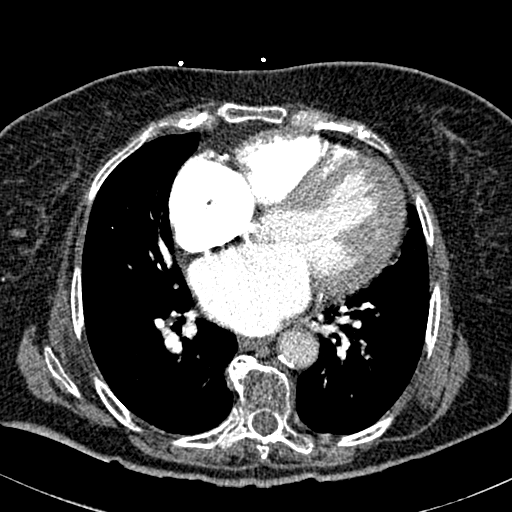
[im 130/259  lung]
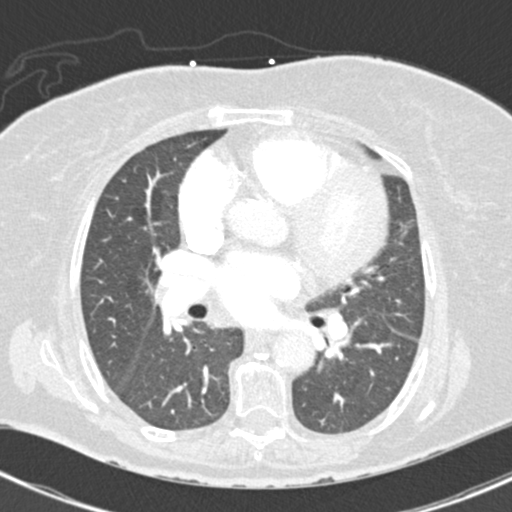
[im 144/259  mediastinal]
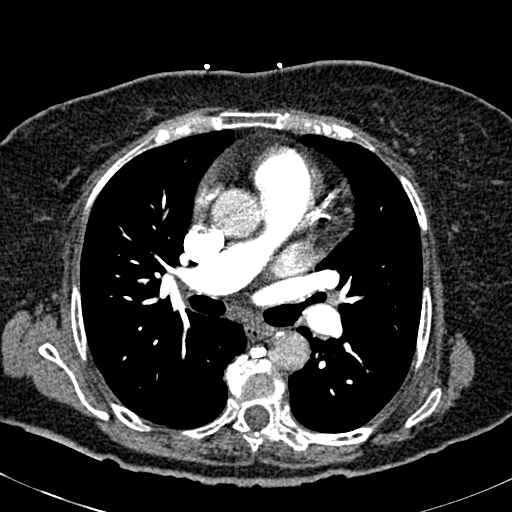
[im 158/259  lung]
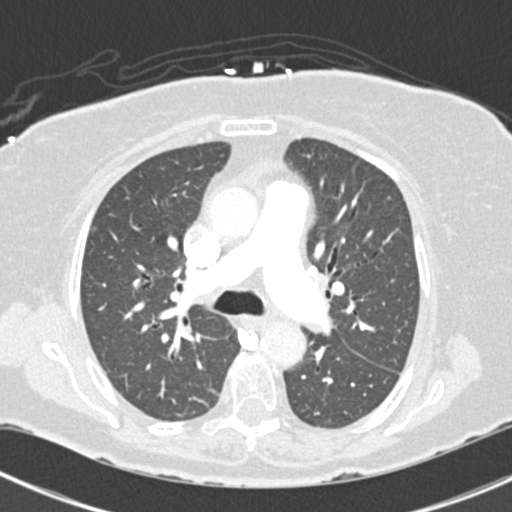
[im 173/259  mediastinal]
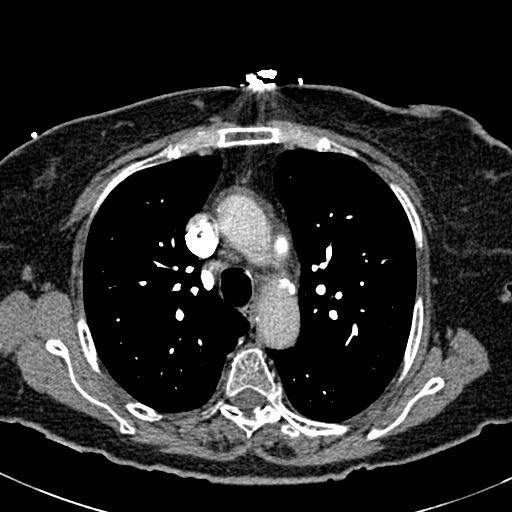
[im 187/259  lung]
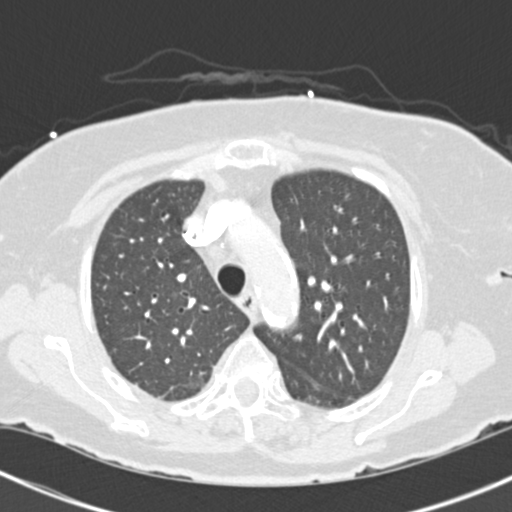
[im 201/259  mediastinal]
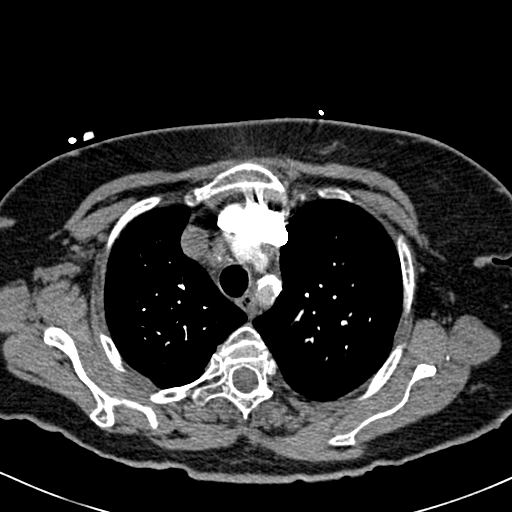
[im 216/259  lung]
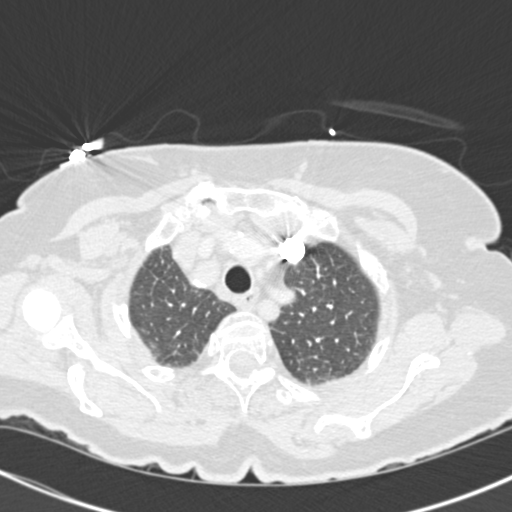
[im 230/259  mediastinal]
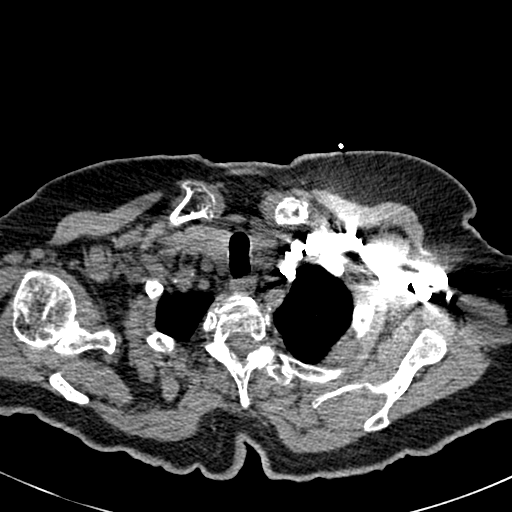
[im 244/259  lung]
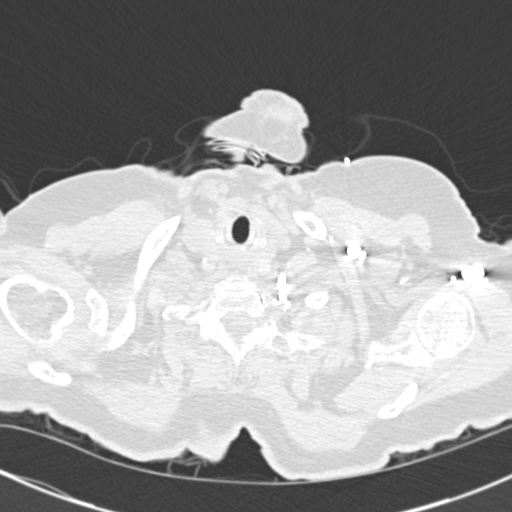

[Series 9: cor pe 2.0 mpr · coronal · 0.68mm/px · 1 of 146 slices shown]
[im 73/146  mediastinal]
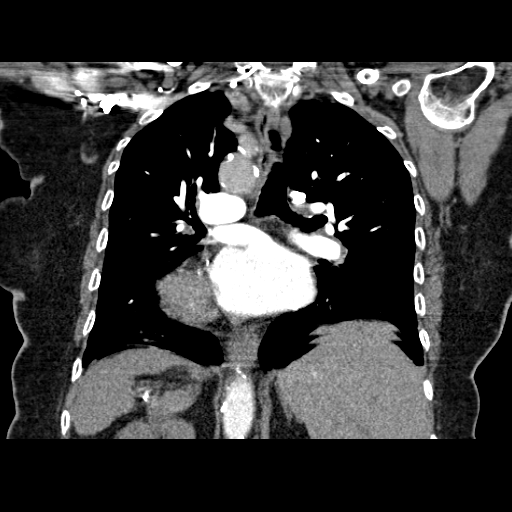

[18 of 36 positions shown; findings below may reference images not displayed]

FINDINGS: Satisfactory opacification of pulmonary arteries noted, and there is
no evidence of pulmonary emboli. Patient breathing during the
acquisition degrades some of the images. Early contrast
opacification of the thoracic aorta, with no evidence of aneurysm,
dissection, or stenosis. Coarse atheromatous plaque in the aortic
arch and coronary arteries. Classic 3 vessel brachiocephalic
arterial origin anatomy without proximal stenosis.

No pleural or pericardial effusion. Mild four-chamber cardiac
enlargement. Sub cm AP window and pretracheal lymph nodes. No hilar
adenopathy. 17 mm subpleural bleb in the inferior aspect right
middle lobe. Minimal linear scarring or subsegmental atelectasis in
the anterior basal segment left lower lobe. Lungs otherwise clear.
Prominent osteophytes at multiple contiguous levels in the mid and
lower thoracic spine. Sternum intact. Visualized portions of upper
abdomen unremarkable.

Review of the MIP images confirms the above findings.
IMPRESSION: 1. Negative for acute PE or thoracic aortic dissection.
2. Atherosclerosis, including aortic and coronary artery disease.
Please note that although the presence of coronary artery calcium
documents the presence of coronary artery disease, the severity of
this disease and any potential stenosis cannot be assessed on this
non-gated CT examination. Assessment for potential risk factor
modification, dietary therapy or pharmacologic therapy may be
warranted, if clinically indicated.
3. Four-chamber cardiac enlargement.  Will
